# Patient Record
Sex: Female | Born: 1970 | Race: Asian | Hispanic: No | Marital: Married | Smoking: Never smoker
Health system: Southern US, Community
[De-identification: ages and names within clinical notes are randomized; demographics above are authoritative.]

## PROBLEM LIST (undated history)

## (undated) DIAGNOSIS — I10 Essential (primary) hypertension: Secondary | ICD-10-CM

## (undated) DIAGNOSIS — F32A Depression, unspecified: Secondary | ICD-10-CM

## (undated) DIAGNOSIS — F329 Major depressive disorder, single episode, unspecified: Secondary | ICD-10-CM

## (undated) DIAGNOSIS — E119 Type 2 diabetes mellitus without complications: Secondary | ICD-10-CM

## (undated) HISTORY — DX: Major depressive disorder, single episode, unspecified: F32.9

## (undated) HISTORY — DX: Essential (primary) hypertension: I10

## (undated) HISTORY — DX: Depression, unspecified: F32.A

---

## 2001-02-25 ENCOUNTER — Encounter: Admission: RE | Admit: 2001-02-25 | Discharge: 2001-02-25 | Payer: Self-pay | Admitting: Family Medicine

## 2001-03-26 ENCOUNTER — Encounter: Admission: RE | Admit: 2001-03-26 | Discharge: 2001-03-26 | Payer: Self-pay | Admitting: Family Medicine

## 2001-03-29 ENCOUNTER — Encounter: Admission: RE | Admit: 2001-03-29 | Discharge: 2001-03-29 | Payer: Self-pay | Admitting: Family Medicine

## 2001-04-12 ENCOUNTER — Other Ambulatory Visit: Admission: RE | Admit: 2001-04-12 | Discharge: 2001-04-12 | Payer: Self-pay | Admitting: Family Medicine

## 2001-04-12 ENCOUNTER — Encounter: Admission: RE | Admit: 2001-04-12 | Discharge: 2001-04-12 | Payer: Self-pay | Admitting: Family Medicine

## 2001-05-22 ENCOUNTER — Encounter: Admission: RE | Admit: 2001-05-22 | Discharge: 2001-05-22 | Payer: Self-pay | Admitting: Family Medicine

## 2001-06-07 ENCOUNTER — Encounter: Admission: RE | Admit: 2001-06-07 | Discharge: 2001-06-07 | Payer: Self-pay | Admitting: Family Medicine

## 2001-06-13 ENCOUNTER — Encounter: Admission: RE | Admit: 2001-06-13 | Discharge: 2001-06-13 | Payer: Self-pay | Admitting: Family Medicine

## 2001-07-09 ENCOUNTER — Encounter: Admission: RE | Admit: 2001-07-09 | Discharge: 2001-07-09 | Payer: Self-pay | Admitting: *Deleted

## 2001-08-16 ENCOUNTER — Encounter (INDEPENDENT_AMBULATORY_CARE_PROVIDER_SITE_OTHER): Payer: Self-pay | Admitting: Specialist

## 2001-08-16 ENCOUNTER — Ambulatory Visit (HOSPITAL_BASED_OUTPATIENT_CLINIC_OR_DEPARTMENT_OTHER): Admission: RE | Admit: 2001-08-16 | Discharge: 2001-08-16 | Payer: Self-pay | Admitting: Otolaryngology

## 2001-11-25 ENCOUNTER — Encounter: Admission: RE | Admit: 2001-11-25 | Discharge: 2001-11-25 | Payer: Self-pay | Admitting: Family Medicine

## 2002-02-19 ENCOUNTER — Encounter: Admission: RE | Admit: 2002-02-19 | Discharge: 2002-02-19 | Payer: Self-pay | Admitting: Family Medicine

## 2002-03-11 ENCOUNTER — Encounter: Admission: RE | Admit: 2002-03-11 | Discharge: 2002-03-11 | Payer: Self-pay | Admitting: Family Medicine

## 2002-03-26 ENCOUNTER — Encounter: Admission: RE | Admit: 2002-03-26 | Discharge: 2002-03-26 | Payer: Self-pay | Admitting: Family Medicine

## 2002-03-28 ENCOUNTER — Encounter: Admission: RE | Admit: 2002-03-28 | Discharge: 2002-03-28 | Payer: Self-pay | Admitting: Sports Medicine

## 2002-03-28 ENCOUNTER — Encounter: Payer: Self-pay | Admitting: Sports Medicine

## 2002-10-13 ENCOUNTER — Encounter: Admission: RE | Admit: 2002-10-13 | Discharge: 2002-10-13 | Payer: Self-pay | Admitting: Family Medicine

## 2002-10-13 ENCOUNTER — Other Ambulatory Visit: Admission: RE | Admit: 2002-10-13 | Discharge: 2002-10-13 | Payer: Self-pay | Admitting: Family Medicine

## 2002-11-12 ENCOUNTER — Encounter: Admission: RE | Admit: 2002-11-12 | Discharge: 2002-11-12 | Payer: Self-pay | Admitting: Family Medicine

## 2002-12-01 ENCOUNTER — Encounter: Admission: RE | Admit: 2002-12-01 | Discharge: 2002-12-01 | Payer: Self-pay | Admitting: Family Medicine

## 2003-01-02 ENCOUNTER — Encounter: Admission: RE | Admit: 2003-01-02 | Discharge: 2003-01-02 | Payer: Self-pay | Admitting: Sports Medicine

## 2003-02-24 ENCOUNTER — Encounter: Admission: RE | Admit: 2003-02-24 | Discharge: 2003-02-24 | Payer: Self-pay | Admitting: Family Medicine

## 2003-02-28 ENCOUNTER — Emergency Department (HOSPITAL_COMMUNITY): Admission: EM | Admit: 2003-02-28 | Discharge: 2003-03-01 | Payer: Self-pay | Admitting: Emergency Medicine

## 2003-03-02 ENCOUNTER — Encounter: Admission: RE | Admit: 2003-03-02 | Discharge: 2003-03-02 | Payer: Self-pay | Admitting: Family Medicine

## 2003-04-20 ENCOUNTER — Encounter: Admission: RE | Admit: 2003-04-20 | Discharge: 2003-04-20 | Payer: Self-pay | Admitting: Family Medicine

## 2003-05-01 ENCOUNTER — Encounter: Admission: RE | Admit: 2003-05-01 | Discharge: 2003-05-01 | Payer: Self-pay | Admitting: Sports Medicine

## 2003-06-25 ENCOUNTER — Encounter: Admission: RE | Admit: 2003-06-25 | Discharge: 2003-06-25 | Payer: Self-pay | Admitting: Family Medicine

## 2003-07-09 ENCOUNTER — Encounter: Admission: RE | Admit: 2003-07-09 | Discharge: 2003-07-09 | Payer: Self-pay | Admitting: Sports Medicine

## 2003-07-30 ENCOUNTER — Encounter: Admission: RE | Admit: 2003-07-30 | Discharge: 2003-07-30 | Payer: Self-pay | Admitting: Family Medicine

## 2003-12-25 ENCOUNTER — Ambulatory Visit: Payer: Self-pay | Admitting: Family Medicine

## 2004-03-14 ENCOUNTER — Ambulatory Visit: Payer: Self-pay | Admitting: Family Medicine

## 2004-05-27 ENCOUNTER — Ambulatory Visit: Payer: Self-pay | Admitting: Family Medicine

## 2004-10-10 ENCOUNTER — Ambulatory Visit: Payer: Self-pay | Admitting: Sports Medicine

## 2004-10-31 ENCOUNTER — Ambulatory Visit: Payer: Self-pay | Admitting: Family Medicine

## 2004-11-16 ENCOUNTER — Encounter (INDEPENDENT_AMBULATORY_CARE_PROVIDER_SITE_OTHER): Payer: Self-pay | Admitting: *Deleted

## 2004-11-16 LAB — CONVERTED CEMR LAB

## 2004-11-25 ENCOUNTER — Ambulatory Visit: Payer: Self-pay | Admitting: Family Medicine

## 2004-12-26 ENCOUNTER — Ambulatory Visit: Payer: Self-pay | Admitting: Family Medicine

## 2005-01-16 DIAGNOSIS — K219 Gastro-esophageal reflux disease without esophagitis: Secondary | ICD-10-CM | POA: Insufficient documentation

## 2005-01-18 ENCOUNTER — Ambulatory Visit: Payer: Self-pay | Admitting: Family Medicine

## 2005-02-01 ENCOUNTER — Ambulatory Visit: Payer: Self-pay | Admitting: Family Medicine

## 2006-03-15 DIAGNOSIS — R51 Headache: Secondary | ICD-10-CM

## 2006-03-15 DIAGNOSIS — R519 Headache, unspecified: Secondary | ICD-10-CM | POA: Insufficient documentation

## 2006-03-16 ENCOUNTER — Encounter (INDEPENDENT_AMBULATORY_CARE_PROVIDER_SITE_OTHER): Payer: Self-pay | Admitting: *Deleted

## 2006-04-11 ENCOUNTER — Telehealth: Payer: Self-pay | Admitting: *Deleted

## 2006-04-13 ENCOUNTER — Ambulatory Visit: Payer: Self-pay | Admitting: Family Medicine

## 2006-04-13 ENCOUNTER — Encounter (INDEPENDENT_AMBULATORY_CARE_PROVIDER_SITE_OTHER): Payer: Self-pay | Admitting: Family Medicine

## 2006-04-13 DIAGNOSIS — E785 Hyperlipidemia, unspecified: Secondary | ICD-10-CM

## 2006-05-25 ENCOUNTER — Ambulatory Visit: Payer: Self-pay | Admitting: Sports Medicine

## 2006-05-25 LAB — CONVERTED CEMR LAB
Ketones, urine, test strip: NEGATIVE
Nitrite: NEGATIVE
Urobilinogen, UA: 0.2
WBC Urine, dipstick: NEGATIVE

## 2006-06-27 ENCOUNTER — Encounter: Payer: Self-pay | Admitting: Family Medicine

## 2006-06-27 ENCOUNTER — Ambulatory Visit: Payer: Self-pay | Admitting: Family Medicine

## 2006-06-27 LAB — CONVERTED CEMR LAB: LDL Goal: 160 mg/dL

## 2006-07-24 ENCOUNTER — Telehealth: Payer: Self-pay | Admitting: *Deleted

## 2006-07-25 ENCOUNTER — Ambulatory Visit: Payer: Self-pay | Admitting: Family Medicine

## 2006-11-01 ENCOUNTER — Emergency Department (HOSPITAL_COMMUNITY): Admission: EM | Admit: 2006-11-01 | Discharge: 2006-11-01 | Payer: Self-pay | Admitting: Emergency Medicine

## 2006-11-05 ENCOUNTER — Telehealth: Payer: Self-pay | Admitting: *Deleted

## 2006-11-06 ENCOUNTER — Ambulatory Visit: Payer: Self-pay | Admitting: Family Medicine

## 2006-11-06 DIAGNOSIS — IMO0002 Reserved for concepts with insufficient information to code with codable children: Secondary | ICD-10-CM

## 2006-12-05 ENCOUNTER — Ambulatory Visit: Payer: Self-pay | Admitting: Family Medicine

## 2006-12-07 ENCOUNTER — Telehealth: Payer: Self-pay | Admitting: *Deleted

## 2007-01-28 ENCOUNTER — Ambulatory Visit: Payer: Self-pay | Admitting: Sports Medicine

## 2007-01-28 ENCOUNTER — Encounter (INDEPENDENT_AMBULATORY_CARE_PROVIDER_SITE_OTHER): Payer: Self-pay | Admitting: Family Medicine

## 2007-02-06 ENCOUNTER — Ambulatory Visit: Payer: Self-pay | Admitting: Family Medicine

## 2007-09-20 ENCOUNTER — Encounter (INDEPENDENT_AMBULATORY_CARE_PROVIDER_SITE_OTHER): Payer: Self-pay | Admitting: Family Medicine

## 2007-09-20 ENCOUNTER — Ambulatory Visit: Payer: Self-pay | Admitting: Family Medicine

## 2007-09-20 DIAGNOSIS — F329 Major depressive disorder, single episode, unspecified: Secondary | ICD-10-CM

## 2007-09-20 DIAGNOSIS — F3289 Other specified depressive episodes: Secondary | ICD-10-CM | POA: Insufficient documentation

## 2007-09-24 ENCOUNTER — Encounter (INDEPENDENT_AMBULATORY_CARE_PROVIDER_SITE_OTHER): Payer: Self-pay | Admitting: Family Medicine

## 2007-10-25 ENCOUNTER — Ambulatory Visit: Payer: Self-pay | Admitting: Family Medicine

## 2007-10-25 ENCOUNTER — Encounter (INDEPENDENT_AMBULATORY_CARE_PROVIDER_SITE_OTHER): Payer: Self-pay | Admitting: Family Medicine

## 2007-11-01 ENCOUNTER — Encounter (INDEPENDENT_AMBULATORY_CARE_PROVIDER_SITE_OTHER): Payer: Self-pay | Admitting: Family Medicine

## 2007-11-07 ENCOUNTER — Telehealth: Payer: Self-pay | Admitting: *Deleted

## 2008-02-03 ENCOUNTER — Telehealth (INDEPENDENT_AMBULATORY_CARE_PROVIDER_SITE_OTHER): Payer: Self-pay | Admitting: Family Medicine

## 2008-05-06 ENCOUNTER — Ambulatory Visit: Payer: Self-pay | Admitting: Family Medicine

## 2008-05-06 ENCOUNTER — Encounter: Payer: Self-pay | Admitting: Family Medicine

## 2008-05-06 ENCOUNTER — Telehealth (INDEPENDENT_AMBULATORY_CARE_PROVIDER_SITE_OTHER): Payer: Self-pay | Admitting: Family Medicine

## 2008-05-06 DIAGNOSIS — R1011 Right upper quadrant pain: Secondary | ICD-10-CM | POA: Insufficient documentation

## 2008-05-06 LAB — CONVERTED CEMR LAB
AST: 19 units/L (ref 0–37)
Albumin: 4.1 g/dL (ref 3.5–5.2)
Alkaline Phosphatase: 52 units/L (ref 39–117)
Glucose, Bld: 80 mg/dL (ref 70–99)
Potassium: 4 meq/L (ref 3.5–5.3)
Sodium: 138 meq/L (ref 135–145)
Total Protein: 7.9 g/dL (ref 6.0–8.3)

## 2009-10-04 ENCOUNTER — Encounter: Payer: Self-pay | Admitting: Family Medicine

## 2009-10-04 ENCOUNTER — Ambulatory Visit: Payer: Self-pay | Admitting: Family Medicine

## 2009-10-04 ENCOUNTER — Ambulatory Visit (HOSPITAL_COMMUNITY)
Admission: RE | Admit: 2009-10-04 | Discharge: 2009-10-04 | Payer: Self-pay | Source: Home / Self Care | Admitting: Family Medicine

## 2009-10-04 DIAGNOSIS — R079 Chest pain, unspecified: Secondary | ICD-10-CM

## 2010-01-21 ENCOUNTER — Ambulatory Visit
Admission: RE | Admit: 2010-01-21 | Discharge: 2010-01-21 | Payer: Self-pay | Source: Home / Self Care | Attending: Family Medicine | Admitting: Family Medicine

## 2010-01-21 DIAGNOSIS — N912 Amenorrhea, unspecified: Secondary | ICD-10-CM | POA: Insufficient documentation

## 2010-01-21 DIAGNOSIS — I10 Essential (primary) hypertension: Secondary | ICD-10-CM | POA: Insufficient documentation

## 2010-01-21 LAB — CONVERTED CEMR LAB
Beta hcg, urine, semiquantitative: NEGATIVE
Bilirubin Urine: NEGATIVE
Blood in Urine, dipstick: NEGATIVE
Glucose, Urine, Semiquant: NEGATIVE
Protein, U semiquant: NEGATIVE
Whiff Test: NEGATIVE
pH: 6.5

## 2010-02-13 LAB — CONVERTED CEMR LAB
Alkaline Phosphatase: 56 units/L (ref 39–117)
BUN: 10 mg/dL (ref 6–23)
Glucose, Bld: 89 mg/dL (ref 70–99)
Total Bilirubin: 0.3 mg/dL (ref 0.3–1.2)

## 2010-02-15 NOTE — Assessment & Plan Note (Signed)
Summary: birth control, abd pain    Vital Signs:  Patient profile:   40 year old female Height:      60.5 inches Weight:      152 pounds BMI:     29.30 Temp:     98.6 degrees F oral Pulse rate:   80 / minute BP sitting:   128 / 84  (right arm) Cuff size:   regular  Vitals Entered By: Tessie Fass CMA (October 04, 2009 2:52 PM) CC: F/U headache  Is Patient Diabetic? No Pain Assessment Patient in pain? yes     Location: head Intensity: 6   Primary Care Provider:  Doree Albee MD  CC:  F/U headache .  History of Present Illness: 40 YOF here for followup visit: -Difficult history secondary to english being second language -Most recent followup > 1 year ago  Pain: Pt reports non specific chest and abdominal pain. Denies any radiation of pain to neck or jaw or diaphoresis per pt. Paiin seems be worse at night, after laying down in bed. No fevers, chills, N/V/D/C. Has stopped taking ppi for at least 1 year  Birth Control: Pt wishes to switch birth control as insurance is no longer covering ortho evra patch. No change in menstrual period since transitioning off of ortho-evra 1-2 months ago. Not sexually active.   Physical Exam  General:  alert and well-nourished.   Head:  normocephalic and atraumatic.   Eyes:  vision grossly intact.   Ears:  R ear normal and L ear normal.   Mouth:  good dentition.   Neck:  supple, full ROM, no LAD  Lungs:  CTAB, no wheezes, rales, rhoncii Heart:  RRR< no rubs, gallops, murmurs  Abdomen:  soft, NT, ND,+ bowel sounds    Impression & Recommendations:  Problem # 1:  CHEST PAIN UNSPECIFIED (ICD-786.50) Will obtain 12 lead EKG to rule out any ischemic cardiac changes. No current chest pain while in office. Overall symptomatology likely secondary to untreated reflux. Will start back on protonix for appropriate coverage. WIll also obtain CMET to better assess hepatic function. Given language barrier, pt instructed to return to clinic in 1-2  weeks with montyard interpretor to to better assess symptoms. Pt agreeable to plan. Chest/Abd pain red flags discussed.  Orders: 12 Lead EKG (12 Lead EKG) [facility]- Est Level  3 ([address])  Problem # 2:  CONTRACEPTIVE MANAGEMENT (ICD-V25.09) WIll start pt on micronor as pt desires pills for birth control. WIll need pap in near future. See #1 about translator. Will assess at next visit.  Orders: [facility]- Est Level  3 ([address])  Complete Medication List: 1)  Celexa 20 Mg Tabs (Citalopram hydrobromide) .Marland Kitchen.. 1 tablet by mouth daily 2)  Flexeril 10 Mg Tabs (Cyclobenzaprine hcl) .Marland Kitchen.. 1 tablet by mouth at bedtime for muscle spasm 3)  Ortho Evra 150-20 Mcg/24hr Ptwk (Norelgestromin-eth estradiol) .... Apply to skin each week for 3 weeks.  take one week off. 4)  Protonix 40 Mg Tbec (Pantoprazole sodium) .... Take 1 tablet daily 5)  Ortho Micronor 0.35 Mg Tabs (Norethindrone) .... Take 1 tablet daily  Other Orders: Comp Met-[facility] ([id number]) Mammogram (Screening) (Mammo) Future Orders: Pap Smear-[facility] ([id number]) ... 12/15/2009 T-Lipid Profile [phone]) ... 10/05/2010  Patient Instructions: 1)  It was good to meet you today 2)  We will be starting you on medication for heartburn 3)  You will also be placed on a new birth control pill 4)  Come back to see me in 1-2 weeks with an  interpretor to go over your headache and belly pain 5)  If you have any fever, worsening abdominal pain, chest pain, or any other concerns, please give [place] a call.  6)  God Bless,  7)  Doree Albee MD  Prescriptions: ORTHO MICRONOR 0.35 MG TABS (NORETHINDRONE) take 1 tablet daily  #30 tabs x 3   Entered and Authorized by:   Doree Albee MD   Signed by:   Doree Albee MD on 10/04/2009   Method used:   Print then Give to Patient   RxID:   [id number] PROTONIX 40 MG TBEC (PANTOPRAZOLE SODIUM) take 1 tablet daily  #30 x 3   Entered and Authorized by:   Doree Albee MD   Signed by:   Doree Albee MD on 10/04/2009    Method used:   Print then Give to Patient   RxID:   [id number]    Prevention & Chronic Care Immunizations   Influenza vaccine: Fluvax 3+  (11/06/2006)   Influenza vaccine due: 11/06/2007    Tetanus booster: Not documented    Pneumococcal vaccine: Not documented  Other Screening   Pap smear: normal  (10/25/2007)   Pap smear action/deferral: Ordered  (10/04/2009)   Pap smear due: 10/24/2008    Mammogram: Not documented   Mammogram action/deferral: Ordered  (10/04/2009)   Smoking status: never  (05/06/2008)  Lipids   Total Cholesterol: Not documented   Lipid panel action/deferral: Lipid Panel ordered   LDL: Not documented   LDL Direct: Not documented   HDL: Not documented   Triglycerides: Not documented    SGOT (AST): 19  (05/06/2008)   SGPT (ALT): 19  (05/06/2008) CMP ordered    Alkaline phosphatase: 52  (05/06/2008)   Total bilirubin: 0.3  (05/06/2008)    Lipid flowsheet reviewed?: Yes   Progress toward LDL goal: Unchanged  Self-Management Support :    Lipid self-management support: Not documented    Nursing Instructions: Schedule screening mammogram (see order) Pap smear today

## 2010-02-17 NOTE — Assessment & Plan Note (Signed)
Summary: c/o abd pain/[place]/bmc   Vital Signs:  Patient profile:   40 year old female Height:      60.5 inches Weight:      153 pounds BMI:     29.50 Temp:     98.3 degrees F oral Pulse rate:   80 / minute BP sitting:   153 / 100  (left arm) Cuff size:   regular  Vitals Entered By: Tessie Fass CMA (January 21, 2010 3:38 PM) CC: abdominal pain Pain Assessment Patient in pain? yes     Location: abdomen Intensity: 5   Primary Care Provider:  Doree Albee MD  CC:  abdominal pain.  History of Present Illness:    Pain all over abdomen x 2 weeks  radiates to back, pain described as cramping pain, pain with heavy lifting, no change with food, has cloudy urine,  +dysuria, incontinence occ with cough, no constipation, no fever, but chills last week, no n/v ,  +discharge, +pruritis with discharge, foul smell a few days ago Normal period in December  +reflux feels burning sensation after eating that moves up, different from cramping   now with irregular cyles, has not had a cyle this month, was due the first of January    Current Medications (verified): 1)  Prilosec 20 Mg Cpdr (Omeprazole) .Marland Kitchen.. 1 By Mouth Daily As Needed Heartburn  Allergies (verified): No Known Drug Allergies  Review of Systems       Per HPI  Physical Exam  General:  alert and well-nourished.  Vital signs noted  Mouth:  MMM Lungs:  CTAB, Heart:  RRR Abdomen:  soft and normal bowel sounds.  Mild TTP over suprapubic region and epigastrium no rebound, no gaurding, no masses felt non distended No CVA tenderness no organomegaly Genitalia:  normal introitus and no external lesions.  minimal clear vaginal discharge, granular apperance to cervix, no friability, no bleeding no CMT no adenxeal masses   Impression & Recommendations:  Problem # 1:  ABDOMINAL PAIN (ICD-789.00) Assessment New non specific exam, UA, Wet prep negative. Upreg negative  Treat reflux Pain not localized for gallballder,  appendix etiology Pt to follow up if menses does not occur, this may be cramping secondarhy to delayed period Pt due for PAP SMEAR Orders: Urinalysis-[facility] (00000) [facility]- Est  Level 4 ([address])  Problem # 2:  GASTROESOPHAGEAL REFLUX DISEASE (ICD-530.81) Assessment: Deteriorated  Start low dose as needed PPI Her updated medication list for this problem includes:    Prilosec 20 Mg Cpdr (Omeprazole) .Marland Kitchen... 1 by mouth daily as needed heartburn  Orders: [facility]- Est  Level 4 (99214)  Problem # 3:  ELEVATED BP READING WITHOUT DX HYPERTENSION (ICD-796.2) Assessment: New  Would recheck at next visit, pt has had some borderline BP before.  Orders: [facility]- Est  Level 4 ([address]214)  Complete Medication List: 1)  Prilosec 20 Mg Cpdr (Omeprazole) .Marland Kitchen.. 1 by mouth daily as needed heartburn  Other Orders: [facility] Preg[facility] ([address]) [facility]  Patient Instructions: 1)  Your pregnancy test is negative 2)  You do not have a urine infection 3)  Start the Omeprazole daily as needed for heartburn 4)  If your cycle does not come on within the next month return for follow-up visit 5)  If you have fever, vomiting, or increased pain then return for a recheck Prescriptions: PRILOSEC 20 MG CPDR (OMEPRAZOLE) 1 by mouth daily as needed heartburn  #30 x 3   Entered and Authorized by:   Milinda Antis MD  Signed by:   Milinda Antis MD on 01/21/2010   Method used:   Electronically to        [facility]. [address] [phone]* (retail)       [address]       [place], [place]  [address]       Ph: [phone] or [phone]       Fax: [phone]   RxID:   [id number]    Orders Added: 1)  Urinalysis-[facility] [00000] 2)  U Preg-[facility] [81025] 3)  Wet Prep- [facility] [87210] 4)  [facility]- Est  Level 4 [66440]    Laboratory Results   Urine Tests  Date/Time Received: January 21, 2010 3:56 PM  Date/Time Reported: January 21, 2010 4:09 PM   Routine Urinalysis   Color: yellow Appearance:  Clear Glucose: negative   (Normal Range: Negative) Bilirubin: negative   (Normal Range: Negative) Ketone: trace (5)   (Normal Range: Negative) Spec. Gravity: >=1.030   (Normal Range: 1.003-1.035) Blood: negative   (Normal Range: Negative) pH: 6.5   (Normal Range: 5.0-8.0) Protein: negative   (Normal Range: Negative) Urobilinogen: 0.2   (Normal Range: 0-1) Nitrite: negative   (Normal Range: Negative) Leukocyte Esterace: negative   (Normal Range: Negative)    Urine HCG: negative Comments: ...............test performed by......Marland KitchenBonnie A. [place], MLS (ASCP)cm  Date/Time Received: January 21, 2010 4:14 PM  Date/Time Reported: January 21, 2010 4:26 PM  [employer] Source: vaginal WBC/hpf: 0-3 Bacteria/hpf: 2+  Rods Clue cells/hpf: none  Negative whiff Yeast/hpf: none Trichomonas/hpf: none Comments: ...........test performed by...........Marland KitchenTerese Door, CMA      Prevention & Chronic Care Immunizations   Influenza vaccine: Fluvax 3+  (11/06/2006)   Influenza vaccine due: 11/06/2007    Tetanus booster: Not documented    Pneumococcal vaccine: Not documented  Other Screening   Pap smear: normal  (10/25/2007)   Pap smear action/deferral: Ordered  (10/04/2009)   Pap smear due: 10/24/2008    Mammogram: Not documented   Mammogram action/deferral: Ordered  (10/04/2009)   Smoking status: never  (05/06/2008)  Lipids   Total Cholesterol: Not documented   Lipid panel action/deferral: Lipid Panel ordered   LDL: Not documented   LDL Direct: Not documented   HDL: Not documented   Triglycerides: Not documented    SGOT (AST): 14  (10/04/2009)   SGPT (ALT): 18  (10/04/2009)   Alkaline phosphatase: 56  (10/04/2009)   Total bilirubin: 0.3  (10/04/2009)  Self-Management Support :    Lipid self-management support: Not documented

## 2010-02-23 ENCOUNTER — Encounter: Payer: Self-pay | Admitting: *Deleted

## 2010-04-08 ENCOUNTER — Encounter: Payer: Self-pay | Admitting: Family Medicine

## 2010-04-08 ENCOUNTER — Ambulatory Visit (HOSPITAL_COMMUNITY)
Admission: RE | Admit: 2010-04-08 | Discharge: 2010-04-08 | Disposition: A | Discharge status: Home / Self Care | Payer: Self-pay | Source: Ambulatory Visit | Attending: Family Medicine | Admitting: Family Medicine

## 2010-04-08 ENCOUNTER — Ambulatory Visit (INDEPENDENT_AMBULATORY_CARE_PROVIDER_SITE_OTHER): Payer: Self-pay | Admitting: Family Medicine

## 2010-04-08 VITALS — Temp 98.4°F | Wt 149.4 lb

## 2010-04-08 DIAGNOSIS — M545 Low back pain, unspecified: Secondary | ICD-10-CM | POA: Insufficient documentation

## 2010-04-08 DIAGNOSIS — M549 Dorsalgia, unspecified: Secondary | ICD-10-CM

## 2010-04-08 DIAGNOSIS — G5702 Lesion of sciatic nerve, left lower limb: Secondary | ICD-10-CM | POA: Insufficient documentation

## 2010-04-08 DIAGNOSIS — M79609 Pain in unspecified limb: Secondary | ICD-10-CM | POA: Insufficient documentation

## 2010-04-08 DIAGNOSIS — R300 Dysuria: Secondary | ICD-10-CM

## 2010-04-08 LAB — POCT URINALYSIS DIPSTICK
Leukocytes, UA: NEGATIVE
Protein, UA: NEGATIVE
Urobilinogen, UA: 0.2
pH, UA: 6.5

## 2010-04-08 MED ORDER — DICLOFENAC SODIUM 75 MG PO TBEC: 75.0000 mg | DELAYED_RELEASE_TABLET | Freq: Two times a day (BID) | ORAL | Status: AC

## 2010-04-08 MED ORDER — CYCLOBENZAPRINE HCL 10 MG PO TABS: 10.0000 mg | ORAL_TABLET | Freq: Three times a day (TID) | ORAL | Status: AC | PRN

## 2010-04-08 NOTE — Assessment & Plan Note (Signed)
Given neg UA, insignificant.

## 2010-04-08 NOTE — Progress Notes (Signed)
  Subjective:    Patient ID: Pamela Stewart, female    DOB: June 30, 1970, 40 y.o.   MRN: [id number]  HPI Has not previously had back pain.  Presents with three days of Rt lower lumbar pain radiating down lateral aspect of leg.  Some tingling.  No apparent weakness.  No trauma.  Does do lifting.    Review of Systems Denies abd pain, fever or change in bowel habits.  + dysuria     Objective:   Physical Exam Abd benign.  No CVA tenderness. Lower lumbar spasm. Normal strength and reflexes in both lower extremities.       Assessment & Plan:

## 2010-04-08 NOTE — Patient Instructions (Signed)
You have back spasms and a pinched nerve causing the right leg pain. Two medicines Diclofenac for pain and inflammation Cyclobenzaprene is a muscle relaxant for the spasm. As she gets better, stop the cyclobenzaprene first.

## 2010-04-08 NOTE — Assessment & Plan Note (Signed)
Good story for lumbar pain with radiculopathy.  No objective findings.  No hx of back problems.  Will check Xrays and treat conservatively.

## 2010-06-03 NOTE — Op Note (Signed)
NAME:  Pamela Stewart, Pamela Stewart                            ACCOUNT NO.:  [id number]   MEDICAL RECORD NO.:  [id number]                   PATIENT TYPE:   LOCATION:                                       FACILITY:   PHYSICIAN:  Dorna Leitz, M.D.                 DATE OF BIRTH:   DATE OF PROCEDURE:  08/16/2001  DATE OF DISCHARGE:                                 OPERATIVE REPORT   PREOPERATIVE DIAGNOSIS:  Chronic tonsillitis, bilateral inferior turbinate  hypertrophy.   POSTOPERATIVE DIAGNOSIS:  Chronic tonsillitis, bilateral inferior turbinate  hypertrophy.   PROCEDURE:  Tonsillectomy, bilateral inferior turbinate reduction.   SURGEON:  Dorna Leitz, M.D.   ANESTHESIA:  General.   ESTIMATED BLOOD LOSS:  20 cc.   SPECIMENS:  Tonsils and turbinates.   COMPLICATIONS:  None.   INDICATION:  This patient is a 40 year old female with a two year history of  headache and nasal congestion with posterior nasal drainage.  There is  ongoing nasal congestion which is not improved with Flonase.  In addition,  there have been chronic sore throats.  She was scheduled for tonsillectomy  prior to moving from [place].  The sore throat has been present for  two years.  For these reasons, tonsillectomy and bilateral inferior  turbinate reductions are performed.   FINDINGS:  The patient was noted to have cryptic 3+ bilateral palatine  tonsils.  There was perfuse bony and soft tissue hypertrophy of the inferior  turbinates.   PROCEDURE:  The patient was taken to the operating room and placed on the  table in a supine position.  She was then placed under general endotracheal  anesthesia and the table rotated counterclockwise 90 degrees.  The neck was  gently extended using a shoulder roll.  Bacitracin was placed on the lips,  and the head and body draped in the usual fashion.  The Crowe-Davis mouthgag  with a #4 tongue blade was then placed intraorally, opened and suspected on  the Mayo stand.   The right palatine tonsil was grasped with Allis clamps and  directed inferomedially.  The harmonic scalpel was then used to resect the  tonsil staying within the peritonsillar space.  The left palatine tonsil was  removed in identical fashion.  A small amount of left superior pole bleeding  was encountered which was controlled with suction cautery.   At this point, an NG tube was placed down the esophagus for suctioning of  the gastric contents.  Nasal cavities were then decongested and the inferior  turbinates injected with 1% Lidocaine with 1:100,000 epinephrine.  Time was  allowed for hemostasis.  A zero degree Storz-Hopkins endoscope was then used  to visualize the left nasal cavity.  A #15 blade was used to incise the  inferior turbinate.  Flaps were elevated medially and laterally and  redundant bone taken down approximately half of the  vertical dimension using  through cut forceps.  This was performed along the entire length of the  turbinate.  Suction cautery was used for hemostasis.  The right inferior  turbinate was reduced in identical fashion and suction cautery used.  Each  of the nasal cavities were then packed with Merocel packs coated with  Bactroban ointment.  They were tied to one another anterior to the  columella.  The oral cavity was suctioned out.  The table was rotated  clockwise 90 degrees to its original position.  The patient was awakened  from anesthesia and extubated in the operating room.  She was taken to the  Post-Anesthesia Care Unit in stable condition.  There were no complications.                                               Dorna Leitz, M.D.    SLJ/MEDQ  D:  08/16/2001  T:  08/22/2001  Job:  [id number]

## 2010-07-01 ENCOUNTER — Ambulatory Visit (INDEPENDENT_AMBULATORY_CARE_PROVIDER_SITE_OTHER): Payer: 59 | Admitting: Family Medicine

## 2010-07-01 ENCOUNTER — Encounter: Payer: Self-pay | Admitting: Family Medicine

## 2010-07-01 DIAGNOSIS — E785 Hyperlipidemia, unspecified: Secondary | ICD-10-CM

## 2010-07-01 DIAGNOSIS — Z309 Encounter for contraceptive management, unspecified: Secondary | ICD-10-CM

## 2010-07-01 LAB — POCT URINE PREGNANCY: Preg Test, Ur: NEGATIVE

## 2010-07-01 MED ORDER — ASPIRIN EC 81 MG PO TBEC: 81.0000 mg | DELAYED_RELEASE_TABLET | Freq: Every day | ORAL | Status: DC

## 2010-07-01 MED ORDER — NORGESTIMATE-ETH ESTRADIOL 0.25-35 MG-MCG PO TABS: 1.0000 | ORAL_TABLET | Freq: Every day | ORAL | Status: DC

## 2010-07-01 NOTE — Patient Instructions (Signed)
It was good to see you again  I am referring you to the gynecologist for you to discuss you possibely getting your tubes tied I am starting you on sprintec in the meantime I also want you to take a daily aspirin  Come back to see me in 2 weeks to talk about your headaches Otherwise call with any questions,  God Bless, Doree Albee MD

## 2010-07-08 DIAGNOSIS — Z309 Encounter for contraceptive management, unspecified: Secondary | ICD-10-CM | POA: Insufficient documentation

## 2010-07-08 NOTE — Assessment & Plan Note (Signed)
Discussed overall case with Dr. Leveda Anna. Pt at increased risk of HTN/VTE with OCP given age. Discussed these risks with the pt and daughter.  Broached issue if possible elective BTL, which pt is agreeable. Will refer to GYN for counseling on this issu e.Will give short term course of OCPs in the interim. Discussed red flags with use. Will also start on daily baby aspirin.

## 2010-07-08 NOTE — Progress Notes (Signed)
  Subjective:    Patient ID: Pamela Stewart, female    DOB: 1970-09-24, 40 y.o.   MRN: [id number]  HPI Pt here to discuss contraception. Pt desires to have no more children. Pt is desiring for OCPs. Pt was previously on mirena IUD several years ago. This was removed.  Pt states that she does not desire to have this again. Pt still menstruating. Non smoker. No hx/o DVT/VTE in the past.  Review of Systems See HPI, otherwise 12 point ROS negative.    Objective:   Physical Exam Gen: up in chair, NAD CV:RRR, no rubs, gallops, murmurs PULM: CTAB,  ABD: S/NT/+bowel sounds EXT: 2+ peripheral pulses    Assessment & Plan:  Contraception: Discussed overall case with Dr. Leveda Anna. Pt at increased risk of HTN/VTE with OCP given age. Discussed these risks with the pt and daughter.  Broached issue if possible elective BTL, which pt is agreeable. Will refer to GYN for counseling on this issu e.Will give short term course of OCPs in the interim. Discussed red flags with use. Will also start on daily baby aspirin.

## 2010-07-22 ENCOUNTER — Encounter: Payer: Self-pay | Admitting: Family Medicine

## 2010-07-22 ENCOUNTER — Ambulatory Visit (INDEPENDENT_AMBULATORY_CARE_PROVIDER_SITE_OTHER): Payer: 59 | Admitting: Family Medicine

## 2010-07-22 VITALS — BP 130/86 | HR 71 | Wt 147.6 lb

## 2010-07-22 DIAGNOSIS — G43909 Migraine, unspecified, not intractable, without status migrainosus: Secondary | ICD-10-CM

## 2010-07-22 DIAGNOSIS — R252 Cramp and spasm: Secondary | ICD-10-CM

## 2010-07-22 LAB — BASIC METABOLIC PANEL
Chloride: 103 mEq/L (ref 96–112)
Potassium: 4.8 mEq/L (ref 3.5–5.3)
Sodium: 138 mEq/L (ref 135–145)

## 2010-07-22 MED ORDER — KETOROLAC TROMETHAMINE 30 MG/ML IJ SOLN
30.0000 mg | Freq: Once | INTRAMUSCULAR | Status: AC
  Administered 2010-07-22: 30 mg via INTRAVENOUS

## 2010-07-22 NOTE — Patient Instructions (Signed)
It was good to see you today STOP taking your birth control pills. I think that this is the cause of your migraines worsening.  I am also going to check some labs today to see if you have any issues with your electrolytes that are causing you muscle cramping.  Come back to see me in 1-2 weeks to see how your headache is doing If you notice any worsening in your headache, any vision changes, significant weakness in one part of your body, give [place] a call or go to the ED.  Otherwise call with any questions, God Bless Doree Albee MD

## 2010-07-22 NOTE — Progress Notes (Signed)
  Subjective:    Patient ID: Pamela Stewart, female    DOB: 22-Sep-1970, 40 y.o.   MRN: [id number]  HPI Pt here to discuss migraines- Pt with previous hx/o migraines that was relatively intermittent. Was tolerable with using prn advil for pain on an intermittent basis. Pt reports acute worsening in headache since last clinical visit. Pt was placed on OCPs at that time in setting of referral to outpt GYN for possible BTL. Pt states that she has had persistent HA w/ HA being worst in PM/at night as well as in am. No focal neurological deficits. Does report some generalized R sided body pain. This has been worked up in the past w/ hx/o muscle spasms and chronic R back pain.        Review of Systems See HPI, otherwise 12 point ROS negative.  Objective:   Physical Exam Gen: up in chair, NAD HEENT: NCAT, EOMI, TMs clear bilaterally, + photophobia with funduscopic examination.  CV: RRR, no murmurs auscultated PULM: CTAB, no wheezes, rales, rhoncii ABD: S/NT/+ bowel sounds  EXT: 2+ peripheral pulses NEURO: CN II-XII grossly intact, no focal neuological deficits.        Assessment & Plan:  HA: Migraine exacerbation, likely secondary to OCP use. Pt instructed to hold OCP use. Will also check BMET in setting of muscle cramps. No noted focal neurological deficits noted on exam which is reassuring. Also noted longstanding hx/o headache per report. Will give toradol 30mg  IM x 1 for acute treatment. PRN ibuprofen for pain. Will follow up in 1 week for reassessment of sxs.  Contraception: Pt pending gyn followup for likely elective BTL. Told pt to use condoms for any sexual activity. Pt is declining any other form of birth control.

## 2010-07-25 DIAGNOSIS — G43909 Migraine, unspecified, not intractable, without status migrainosus: Secondary | ICD-10-CM | POA: Insufficient documentation

## 2010-07-25 NOTE — Assessment & Plan Note (Signed)
:   Migraine exacerbation, likely secondary to OCP use. Pt instructed to hold OCP use. Will also check BMET in setting of muscle cramps. No noted focal neurological deficits noted on exam which is reassuring. Also noted longstanding hx/o headache per report. Will give toradol 30mg  IM x 1 for acute treatment. PRN ibuprofen for pain. Will follow up in 1 week for reassessment of sxs.  Contraception: Pt pending gyn followup for likely elective BTL. Told pt to use condoms for any sexual activity. Pt is declining any other form of birth control.

## 2010-07-27 ENCOUNTER — Ambulatory Visit (INDEPENDENT_AMBULATORY_CARE_PROVIDER_SITE_OTHER): Payer: 59 | Admitting: Family Medicine

## 2010-07-27 ENCOUNTER — Ambulatory Visit: Payer: 59 | Admitting: Family Medicine

## 2010-07-27 ENCOUNTER — Encounter: Payer: Self-pay | Admitting: Family Medicine

## 2010-07-27 VITALS — BP 132/87 | HR 64 | Temp 98.3°F | Wt 147.0 lb

## 2010-07-27 DIAGNOSIS — R51 Headache: Secondary | ICD-10-CM

## 2010-07-27 MED ORDER — PROPRANOLOL HCL 40 MG PO TABS: 40.0000 mg | ORAL_TABLET | Freq: Three times a day (TID) | ORAL | Status: DC

## 2010-07-27 MED ORDER — KETOROLAC TROMETHAMINE 30 MG/ML IJ SOLN
30.0000 mg | Freq: Once | INTRAMUSCULAR | Status: AC
  Administered 2010-07-27: 30 mg via INTRAMUSCULAR

## 2010-07-27 MED ORDER — SUMATRIPTAN SUCCINATE 50 MG PO TABS: 50.0000 mg | ORAL_TABLET | Freq: Once | ORAL | Status: DC | PRN

## 2010-07-27 MED ORDER — PROPRANOLOL HCL 40 MG PO TABS: 40.0000 mg | ORAL_TABLET | Freq: Two times a day (BID) | ORAL | Status: DC

## 2010-07-27 NOTE — Patient Instructions (Signed)
It was good to see you today I am starting you on propranolol for your headache I have also wrote a prescription for imitrex for breakthrough migraines Come back to see me in 2 weeks for follow up of your headaches,  Please call with any questions that you may have God Bless, Doree Albee MD

## 2010-07-27 NOTE — Progress Notes (Signed)
  Subjective:    Patient ID: Pamela Stewart, female    DOB: Dec 17, 1970, 40 y.o.   MRN: [id number]  HPI Pt here for follow up on HA. Pt recently seen 07/21/10 for headache in setting of recently being placed on OCPs. OCPs recently d/c'd at most recent clinical visit as this was thought to be inciting agent for headache spike. Pt also given shot of toradol. \ Today- Pt states that HA resolved for approx 1-2 days s/p toradol, However recurred despite OCP d/c. Pt states that HA most prominent at night. Usually unilateral, frontal, with eye tearing, with alternating  Intermittent photophobia. No blurred vision, hemiparesis. Has a hx/o headaches in the past per pt. Currently with headache type pain.    Review of Systems See HPI, otherwise 12 point ROS negative.     Objective:   Physical Exam Gen: up in chair, NAD  HEENT: NCAT, EOMI, TMs clear bilaterally, + photophobia with funduscopic examination.  CV: RRR, no murmurs auscultated  PULM: CTAB, no wheezes, rales, rhoncii  ABD: S/NT/+ bowel sounds  EXT: 2+ peripheral pulses  NEURO: CN II-XII grossly intact, no focal neuological deficits.     Assessment & Plan:  HA: Likely migraine vs cluster headache given sxs. Will start propranolol for prophylactic treatment in setting of borderline BPs. Will also give triptan for breakthrough headaches. No known hx/o CAD/ischemic heart disease. Will follow up in 2-3 weeks to reassess sxs. Toradol 30 IM x1 in clinic.

## 2010-07-28 LAB — SEDIMENTATION RATE: Sed Rate: 32 mm/hr — ABNORMAL HIGH (ref 0–22)

## 2010-08-19 ENCOUNTER — Ambulatory Visit: Payer: 59 | Admitting: Family Medicine

## 2010-09-02 ENCOUNTER — Ambulatory Visit (INDEPENDENT_AMBULATORY_CARE_PROVIDER_SITE_OTHER): Payer: 59 | Admitting: Family Medicine

## 2010-09-16 ENCOUNTER — Ambulatory Visit (INDEPENDENT_AMBULATORY_CARE_PROVIDER_SITE_OTHER): Payer: 59 | Admitting: Family Medicine

## 2010-09-16 ENCOUNTER — Encounter: Payer: Self-pay | Admitting: Family Medicine

## 2010-09-16 DIAGNOSIS — R51 Headache: Secondary | ICD-10-CM

## 2010-09-16 DIAGNOSIS — Z309 Encounter for contraceptive management, unspecified: Secondary | ICD-10-CM

## 2010-09-16 DIAGNOSIS — K219 Gastro-esophageal reflux disease without esophagitis: Secondary | ICD-10-CM

## 2010-09-16 DIAGNOSIS — G43909 Migraine, unspecified, not intractable, without status migrainosus: Secondary | ICD-10-CM

## 2010-09-16 DIAGNOSIS — F329 Major depressive disorder, single episode, unspecified: Secondary | ICD-10-CM

## 2010-09-16 MED ORDER — BUPROPION HCL 75 MG PO TABS: 75.0000 mg | ORAL_TABLET | Freq: Three times a day (TID) | ORAL | Status: DC

## 2010-09-16 MED ORDER — PROPRANOLOL HCL 40 MG PO TABS: 80.0000 mg | ORAL_TABLET | Freq: Two times a day (BID) | ORAL | Status: DC

## 2010-09-16 MED ORDER — OMEPRAZOLE 20 MG PO CPDR: 20.0000 mg | DELAYED_RELEASE_CAPSULE | Freq: Every day | ORAL | Status: DC

## 2010-09-16 MED ORDER — FLUOXETINE HCL 10 MG PO CAPS: 10.0000 mg | ORAL_CAPSULE | Freq: Every day | ORAL | Status: DC

## 2010-09-16 NOTE — Patient Instructions (Addendum)
It was good to see you  I am increasing your headache medication  I am also placing you on medication for your mood I am putting you on medication for heartburn  Come back to see me in 2 weeks, Doree Albee MD

## 2010-09-19 NOTE — Assessment & Plan Note (Signed)
Will increase propranolol to 80 mg po BID. Will follow up in 2 weeks.

## 2010-09-19 NOTE — Assessment & Plan Note (Signed)
Will not place IUD today. Stressed importance of proper follow up with OB-GYN. Pt agreeable to plan. Will reassess for proper GYN follow up at next pt visit.

## 2010-09-19 NOTE — Progress Notes (Signed)
  Subjective:    Patient ID: Pamela Stewart, female    DOB: Apr 28, 1970, 40 y.o.   MRN: [id number]  HPI This is a chronic problem follow up;   Migraine : Pt states that HA has been intermittently worse since last clinical visit. Pt was recently placed on propranolol 40 BID in setting of concominant intermittent elevated BPs. Pt states that medication has been somewhat effective. Pt states that she has also been under a lot of stress with work as well as at home. Pt states that she has noticed HA worsen around time after dinner, or at night.   Mood: Pt also states that she has had significant depressive type sxs since last clinical visit. No HI/SI. HAs had issues with sleep with generalized feeling of anxiety that has been feeding into and exaerbations of HA.   Contraception: Pt previously scheduled to go to [facility] for referral for possible BTL vs hysterectomy. Pt has not gone to the appt. Pt stated previously that she did not desire an IUD. Pt states that she now desires this.    Review of Systems See HPI    Objective:   Physical Exam Gen: up in chair, NAD  HEENT: NCAT, EOMI, TMs clear bilaterally, + photophobia with funduscopic examination.  CV: RRR, no murmurs auscultated  PULM: CTAB, no wheezes, rales, rhoncii  ABD: S/NT/+ bowel sounds  EXT: 2+ peripheral pulses  NEURO: CN II-XII grossly intact, no focal neuological deficits.    Assessment & Plan:

## 2010-09-19 NOTE — Assessment & Plan Note (Signed)
Will start pt on wellbutrin for mood. PHQ-9 form not filled out due to time constraints and translation of written instructions. I suspect this may be contributing to headaches as well. Will Follow up in 2 weeks to reassess. Another consideration is transitioning pt to amitryptiline if pt's HAs are poorly controlled with high dose propranolol over

## 2010-09-19 NOTE — Assessment & Plan Note (Signed)
Will start pt on omeprazole to decrease of GI source as preciptitant

## 2010-09-30 ENCOUNTER — Ambulatory Visit (INDEPENDENT_AMBULATORY_CARE_PROVIDER_SITE_OTHER): Payer: 59 | Admitting: Family Medicine

## 2010-09-30 ENCOUNTER — Encounter: Payer: Self-pay | Admitting: Family Medicine

## 2010-09-30 DIAGNOSIS — F329 Major depressive disorder, single episode, unspecified: Secondary | ICD-10-CM

## 2010-09-30 DIAGNOSIS — G43909 Migraine, unspecified, not intractable, without status migrainosus: Secondary | ICD-10-CM

## 2010-09-30 DIAGNOSIS — Z23 Encounter for immunization: Secondary | ICD-10-CM

## 2010-09-30 MED ORDER — KETOROLAC TROMETHAMINE 60 MG/2ML IM SOLN
30.0000 mg | Freq: Once | INTRAMUSCULAR | Status: AC
  Administered 2010-09-30: 30 mg via INTRAMUSCULAR

## 2010-09-30 NOTE — Assessment & Plan Note (Signed)
Subjectively improved. Though, pt does report of daily headache. I do suspect that there is a large component of medication non-compliance in terms of pt's overall picture. ( in further discussion, pt states that she misses medications regularly). Will continue with current regimen. toradol shot given today for acute headache.  Plan to follow with pt in the next 1-2 weeks. Pt is instructed to bring in all medications taken at this visit in addition to family to more accurately assess medication regimen. Pt agreeable to plan.

## 2010-09-30 NOTE — Patient Instructions (Signed)
It was good to see you today  I am not going to make any medication changes today  Please set up an appointment with me in the next 1-2 weeks. Bring ALL of your medications with your at this visit so we can better review your medications. We will also try to set you up to see the gynecologist about your birth control options.  Call if you have any other questions,  God Bless Doree Albee MD

## 2010-09-30 NOTE — Progress Notes (Signed)
  Subjective:    Patient ID: Pamela Stewart, female    DOB: 24-Jun-1970, 40 y.o.   MRN: [id number]  HPI This is a follow up for chronic problems: Migraine: improved since increase of propranolol. HAs were previously 10/10, now 6/10. Still with HAs daily. Says she takes different medications for this including ASA, tylenol, and ibuprofen. Pt is unsure if she has taken imitrex for her headache as previously prescribed.   Mood: Pt states that her mood has improved since her last clinical visit. No HI/SI. When asked, pt states that she taking "whatever is on the sheet". Mood medications currently include prozac and wellbutrin.    Review of Systems See HPI     Objective:   Physical Exam Gen: up in chair, NAD CV: RRR, no murmurs auscultated  PULM: CTAB, no wheezes, rales, rhoncii  ABD: S/NT/+ bowel sounds  EXT: 2+ peripheral pulses    Assessment & Plan:

## 2010-09-30 NOTE — Assessment & Plan Note (Addendum)
Subjectively improved per pt. As previously stated, I am unsure of pt's compliance with medication regimen. I think there are significant potential barriers to medication regimen specifically language. Will make no medication changes today. Would like to follow up with pt in next 1-2 weeks with pt being instructed to bring in all medications as well as family to better assess medication regimen. Pt would likely greatly benefit from pharmacy clinic after assessment.

## 2010-11-18 ENCOUNTER — Other Ambulatory Visit (HOSPITAL_COMMUNITY)
Admission: RE | Admit: 2010-11-18 | Discharge: 2010-11-18 | Disposition: A | Discharge status: Home / Self Care | Payer: 59 | Source: Ambulatory Visit | Attending: Family Medicine | Admitting: Family Medicine

## 2010-11-18 ENCOUNTER — Encounter: Payer: Self-pay | Admitting: Family Medicine

## 2010-11-18 ENCOUNTER — Ambulatory Visit (INDEPENDENT_AMBULATORY_CARE_PROVIDER_SITE_OTHER): Payer: 59 | Admitting: Family Medicine

## 2010-11-18 DIAGNOSIS — Z01419 Encounter for gynecological examination (general) (routine) without abnormal findings: Secondary | ICD-10-CM | POA: Insufficient documentation

## 2010-11-18 DIAGNOSIS — Z124 Encounter for screening for malignant neoplasm of cervix: Secondary | ICD-10-CM

## 2010-11-18 DIAGNOSIS — Z309 Encounter for contraceptive management, unspecified: Secondary | ICD-10-CM

## 2010-11-18 LAB — POCT URINE PREGNANCY: Preg Test, Ur: NEGATIVE

## 2010-11-18 MED ORDER — LEVONORGESTREL 20 MCG/24HR IU IUD
INTRAUTERINE_SYSTEM | Freq: Once | INTRAUTERINE | Status: AC
  Administered 2010-11-18: 15:00:00 via INTRAUTERINE

## 2010-11-18 NOTE — Patient Instructions (Signed)
It was good to see you today  I am referring you to the gynecologist for a possible IUD insertion and to discuss birth control options. Come back to see me in 1 month  Be sure to [facility] MEDICINES AT YOUR NEXT VISIT Call with any questions,  God Bless, Doree Albee MD

## 2010-11-19 DIAGNOSIS — Z124 Encounter for screening for malignant neoplasm of cervix: Secondary | ICD-10-CM | POA: Insufficient documentation

## 2010-11-19 NOTE — Assessment & Plan Note (Addendum)
Pap smear also obtained. Last 3 pap smears reviewed, which were all negative.  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

## 2010-11-19 NOTE — Assessment & Plan Note (Addendum)
Procedure: Placement of  Mirena  Patient was counseled regarding the risks/benefits/alternatives of the IUD. Consent was obtained and all questions answered. Urine pregnancy test negative, patient currently menstruating.  Description: Cervix was swabbed three times with Betadine swabs. Sterile gloves donned. Sterile single-tooth tenaculum used to grasp posterior lip of the cervix and straighten the endocervical canal. Noted mild cervical stricture at cervical os.  Mild difficulty noted in placing sound past cervical os.  Uterus sounded to 6.5 cm. IUD loaded per manufacturer's instruction and flange set to 6.5 cm. IUD placed per manufacturer's directions. Strings trimmed to1cm. IUD was noted to be visible in cervical os after placement and was therefore removed.    After procedure, pt was instructed to follow with GYN for further management of contraception and IUD placement as IUD was unsuccessfully placed today. Overall case reviewed with Dr. Deirdre Priest. Will place pt on 1 month of OCPs.

## 2010-11-19 NOTE — Progress Notes (Signed)
  Subjective:    Patient ID: Pamela Stewart, female    DOB: [birth year], 40 y.o.   MRN: [id number]  HPI Pt is here for general medical follow up and contraception management.  Pt was seen approx 2 months ago for chronic problem follow up. At the time there was a high concern for medication noncompliance as pt had recurrent episodes of headache, poor mood, and reflux as well as pt being unsure of medication regimen. Pt was instructed to come back in today with all medications, as to better assess regimen.   Today, pt states that she has forgotten to bring medications.   Contraception Management: Pt was previously referred to GYN for this as pt refused IUD and implanon in our past visit. Pt was considering hysterectomy vs OCPs. It was discussed with pt in the past that OCPs may have some secondary complications including VTE and increased BP and this was deferred.   Today pt states that she now desires IUD for contraception. Pt states that she has not gone to GYN referral because she was unsure of appt. Pt states that she has reconsidered this and thinks this would be the best option for birth control vs. Hysterectomy.    Review of Systems See HPI     Objective:   Physical Exam Gen: in bed, NAD GU: normal external genitalia, noted diffuse cystic changes on cervix consistent with possible nabothian cysts, blood in cervical os consistent with menses.       Assessment & Plan:  Medication Management: Discussed with pt importance of bringing medications to assess overall regimen. Pt instructed to follow up in 1 month. Will likely refer to pharmacy clinic in the next 1-2 months for medication reconciliation.

## 2010-12-02 ENCOUNTER — Ambulatory Visit: Payer: 59 | Admitting: Gynecology

## 2010-12-23 ENCOUNTER — Ambulatory Visit: Payer: 59 | Admitting: Family Medicine

## 2011-03-08 ENCOUNTER — Encounter: Payer: Self-pay | Admitting: *Deleted

## 2011-03-10 ENCOUNTER — Ambulatory Visit (INDEPENDENT_AMBULATORY_CARE_PROVIDER_SITE_OTHER): Payer: 59 | Admitting: Family Medicine

## 2011-03-10 ENCOUNTER — Encounter: Payer: Self-pay | Admitting: Family Medicine

## 2011-03-10 DIAGNOSIS — K219 Gastro-esophageal reflux disease without esophagitis: Secondary | ICD-10-CM

## 2011-03-10 DIAGNOSIS — Z309 Encounter for contraceptive management, unspecified: Secondary | ICD-10-CM

## 2011-03-10 DIAGNOSIS — F329 Major depressive disorder, single episode, unspecified: Secondary | ICD-10-CM

## 2011-03-10 DIAGNOSIS — G43909 Migraine, unspecified, not intractable, without status migrainosus: Secondary | ICD-10-CM

## 2011-03-10 DIAGNOSIS — R51 Headache: Secondary | ICD-10-CM

## 2011-03-10 MED ORDER — SUMATRIPTAN SUCCINATE 50 MG PO TABS: 50.0000 mg | ORAL_TABLET | Freq: Once | ORAL | Status: DC | PRN

## 2011-03-10 MED ORDER — OMEPRAZOLE 20 MG PO CPDR: 20.0000 mg | DELAYED_RELEASE_CAPSULE | Freq: Every day | ORAL | Status: DC

## 2011-03-10 MED ORDER — FLUOXETINE HCL 10 MG PO CAPS: 10.0000 mg | ORAL_CAPSULE | Freq: Every day | ORAL | Status: DC

## 2011-03-10 NOTE — Assessment & Plan Note (Signed)
Refilled today. Discussed adherence to medication.

## 2011-03-10 NOTE — Patient Instructions (Signed)
You have a mild form of depression. I am restarting you on medication for this.   TAKE these medications EVERYDAY:  Prozac Prilosec Propranolol Aspirin  Take these medications AS NEEDED: Imitrex/sumatriptan for breakthrough headaches.  PLEASE CALL HERE IF YOU HAVE TO TAKE THIS MEDICATION EVERYDAY   MAKE AN APPOINTMENT TO SEE OUR PHARMACIST  MAKE SURE TO FOLLOW UP WITH THE GYNECOLOGIST  COME BACK TO SEE ME IN 3-4 WEEKS. CALL WITH ANY QUESTIONS.  Doree Albee MD

## 2011-03-10 NOTE — Assessment & Plan Note (Signed)
PHQ-9 score of 10. Pt noted to be tearful after completing. Refilled prozac today. Discussed psych red flags. WIll follow up in 3-4 weeks to reassess mood. Stressed importance of adherence to help with mood. Pt and family agreeable.

## 2011-03-10 NOTE — Assessment & Plan Note (Signed)
Worsening secondary to medical noncompliance. This is likely multifactorial with major contribution being likely language vs. Medical literacy. Pt formally referred to pharmacy clinic to help with medical reconciliation. Scheduled medication list written on AVS and explained to pt and daughter to help with understanding. Instructed pt to take propranolol as prescribed, but to contact us if develops any dizziness or weakness with this.

## 2011-03-10 NOTE — Assessment & Plan Note (Addendum)
Referred pt back to GYN. Discussed relative risk of OCP use in her age group in relation to VTE and CV events. Discussed with pt that birth control options would be better dicussed with a gynecologist. Pt states that she is agreeable to this.

## 2011-03-10 NOTE — Progress Notes (Signed)
  Subjective:    Patient ID: Pamela Stewart, female    DOB: 1970-07-30, 41 y.o.   MRN: [id number]  HPI Pt presents today for general follow up visit.  Partial translation done through pt's daughters who are also present.   Headaches: pt states that she has had recurrence of her baseline headaches over the last 3-4 months. Pt is currently on prophylactic propranolol for this. Pt states that she has not been taking this medication as prescribed. Pt states that she uses once to twice weekly whereas medication is written as TID. Pt is also on imitrex for breakthrough migraine treatment. Pt states that she has been taking this medication on a nightly basis. Pt reports that she has had some mild dizziness associated with this medication. Especially at night. Pt states that she take other medications at night at the same time as imitrex. Pt is unsure what medications she is taking at night with imitrex.   Mood: Pt and family also report pt with worsening sleep pattern and worsening mood. Both patient and family report pt with worsening mood. No HI or SI. Family states that there have been a lot of family stressors recently.   Contraception: Pt was previously referred to gynecology in setting of failed IUD placement to discuss birth control options. Pt was previously agreeable to this, however pt states that she did not follow up on this appointment. Pt states that she would like to just be on OCPs for birth control.   Review of Systems See HPI, otherwise ROS negative.     Objective:   Physical Exam Gen: up in chair, NAD HEENT: NCAT, EOMI, TMs clear bilaterally CV: RRR, no murmurs auscultated PULM: CTAB, no wheezes, rales, rhoncii ABD: S/NT/+ bowel sounds  EXT: 2+ peripheral pulses   Assessment & Plan:

## 2011-03-13 ENCOUNTER — Encounter: Payer: Self-pay | Admitting: *Deleted

## 2011-03-15 ENCOUNTER — Encounter: Payer: Self-pay | Admitting: Obstetrics & Gynecology

## 2011-04-07 ENCOUNTER — Ambulatory Visit: Payer: 59 | Admitting: Pharmacist

## 2011-04-14 ENCOUNTER — Encounter: Payer: 59 | Admitting: Obstetrics & Gynecology

## 2011-04-26 ENCOUNTER — Encounter: Payer: 59 | Admitting: Obstetrics & Gynecology

## 2011-04-28 ENCOUNTER — Ambulatory Visit: Payer: 59 | Admitting: Family Medicine

## 2011-06-23 ENCOUNTER — Encounter (HOSPITAL_COMMUNITY): Payer: Self-pay | Admitting: Emergency Medicine

## 2011-06-23 ENCOUNTER — Emergency Department (HOSPITAL_COMMUNITY)
Admission: EM | Admit: 2011-06-23 | Discharge: 2011-06-23 | Disposition: A | Discharge status: Home / Self Care | Payer: 59 | Attending: Emergency Medicine | Admitting: Emergency Medicine

## 2011-06-23 DIAGNOSIS — L509 Urticaria, unspecified: Secondary | ICD-10-CM | POA: Insufficient documentation

## 2011-06-23 MED ORDER — DIPHENHYDRAMINE HCL 25 MG PO TABS: 50.0000 mg | ORAL_TABLET | Freq: Three times a day (TID) | ORAL | Status: DC | PRN

## 2011-06-23 MED ORDER — FAMOTIDINE 20 MG PO TABS: 20.0000 mg | ORAL_TABLET | Freq: Two times a day (BID) | ORAL | Status: DC

## 2011-06-23 MED ORDER — DIPHENHYDRAMINE HCL 50 MG/ML IJ SOLN
50.0000 mg | Freq: Once | INTRAMUSCULAR | Status: AC
  Administered 2011-06-23: 50 mg via INTRAVENOUS
  Filled 2011-06-23: qty 1

## 2011-06-23 MED ORDER — PREDNISONE 10 MG PO TABS: 60.0000 mg | ORAL_TABLET | Freq: Every day | ORAL | Status: DC

## 2011-06-23 MED ORDER — METHYLPREDNISOLONE SODIUM SUCC 125 MG IJ SOLR
125.0000 mg | Freq: Once | INTRAMUSCULAR | Status: AC
  Administered 2011-06-23: 125 mg via INTRAVENOUS
  Filled 2011-06-23: qty 2

## 2011-06-23 MED ORDER — FAMOTIDINE IN NACL 20-0.9 MG/50ML-% IV SOLN
20.0000 mg | Freq: Once | INTRAVENOUS | Status: AC
  Administered 2011-06-23: 20 mg via INTRAVENOUS
  Filled 2011-06-23: qty 50

## 2011-06-23 NOTE — ED Notes (Signed)
PT. REPORTS PERSISTENT GENERALIZED ITCHY RASHES / HIVES FOR 2 WEEKS UNRELIEVED BY OTC BENADRYL AND HYDROCORTIZONE CREAM .

## 2011-06-23 NOTE — ED Provider Notes (Signed)
History     CSN: [id number]  Arrival date & time 06/23/11  0105   First MD Initiated Contact with Patient 06/23/11 0135      Chief Complaint  Patient presents with  . Urticaria    ) The history is provided by the patient and a relative.   the patient reports she's had hives for approximately 2 weeks.  Her hives are minimal during the day but when she comes home and begins working in the garden become severe.  She's tried Benadryl and hydrocortisone cream without improvement in her symptoms.  She denies lightheadedness weakness difficulty breathing or swallowing.  She has no history of asthma or eczema.  She's never really had allergies before.  She cannot think of new foods lotions or other possible exposures that would be new for her.  Her symptoms are mild to moderate at this time  History reviewed. No pertinent past medical history.  History reviewed. No pertinent past surgical history.  No family history on file.  History  Substance Use Topics  . Smoking status: Never Smoker   . Smokeless tobacco: Never Used  . Alcohol Use: No    OB History    Grav Para Term Preterm Abortions TAB SAB Ect Mult Living                  Review of Systems  All other systems reviewed and are negative.    Allergies  Review of patient's allergies indicates no known allergies.  Home Medications   Current Outpatient Rx  Name Route Sig Dispense Refill  . FLUOXETINE HCL 10 MG PO CAPS Oral Take 1 capsule (10 mg total) by mouth daily. 30 capsule 2  . OMEPRAZOLE 20 MG PO CPDR Oral Take 1 capsule (20 mg total) by mouth daily. 30 capsule 1  . PROPRANOLOL HCL 40 MG PO TABS Oral Take 2 tablets (80 mg total) by mouth 2 (two) times daily. 60 tablet 3  . SUMATRIPTAN SUCCINATE 50 MG PO TABS Oral Take 1 tablet (50 mg total) by mouth once as needed for migraine. 30 tablet 2  . DIPHENHYDRAMINE HCL 25 MG PO TABS Oral Take 2 tablets (50 mg total) by mouth every 8 (eight) hours as needed for itching. 20  tablet 0  . FAMOTIDINE 20 MG PO TABS Oral Take 1 tablet (20 mg total) by mouth 2 (two) times daily. 10 tablet 0  . PREDNISONE 10 MG PO TABS Oral Take 6 tablets (60 mg total) by mouth daily. 30 tablet 0    BP 148/82  Pulse 68  Temp(Src) 98.5 F (36.9 C) (Oral)  Resp 18  SpO2 100%  LMP 05/31/2011  Physical Exam  Nursing note and vitals reviewed. Constitutional: She is oriented to person, place, and time. She appears well-developed and well-nourished. No distress.  HENT:  Head: Normocephalic and atraumatic.       Oral airway is patent.  No lip or posterior pharyngeal swelling.  Speech is normal  Eyes: EOM are normal.  Neck: Normal range of motion.  Cardiovascular: Normal rate, regular rhythm and normal heart sounds.   Pulmonary/Chest: Effort normal and breath sounds normal.  Abdominal: Soft. She exhibits no distension. There is no tenderness.  Musculoskeletal: Normal range of motion.  Neurological: She is alert and oriented to person, place, and time.  Skin: Skin is warm and dry. Rash noted.       Diffuse urticaria throughout her legs chest back and arms  Psychiatric: She has a normal mood and  affect. Judgment normal.    ED Course  Procedures (including critical care time)  Labs Reviewed - No data to display No results found.   1. Urticaria       MDM  Patient presents with what appears to be urticaria without anaphylaxis or anaphylactic shock.  The patient responded quickly to Benadryl Pepcid and steroids.  The patient will be discharged home on the same.  No indication for epinephrine in the emergency department.  .  Nothing to suggest need to be sent home with epinephrine pen.  She understands return the emergency department for new or worsening symptoms        Lyanne Co, MD 06/23/11 [phone]

## 2011-07-06 ENCOUNTER — Encounter: Payer: Self-pay | Admitting: Family Medicine

## 2011-07-06 ENCOUNTER — Ambulatory Visit (INDEPENDENT_AMBULATORY_CARE_PROVIDER_SITE_OTHER): Payer: 59 | Admitting: Family Medicine

## 2011-07-06 VITALS — BP 154/100 | HR 69 | Temp 98.2°F | Ht 60.5 in | Wt 146.0 lb

## 2011-07-06 DIAGNOSIS — L509 Urticaria, unspecified: Secondary | ICD-10-CM

## 2011-07-06 DIAGNOSIS — I10 Essential (primary) hypertension: Secondary | ICD-10-CM

## 2011-07-06 LAB — COMPREHENSIVE METABOLIC PANEL
ALT: 39 U/L — ABNORMAL HIGH (ref 0–35)
AST: 22 U/L (ref 0–37)
CO2: 29 mEq/L (ref 19–32)
Chloride: 103 mEq/L (ref 96–112)
Sodium: 138 mEq/L (ref 135–145)
Total Bilirubin: 0.5 mg/dL (ref 0.3–1.2)
Total Protein: 6.8 g/dL (ref 6.0–8.3)

## 2011-07-06 LAB — LDL CHOLESTEROL, DIRECT: Direct LDL: 149 mg/dL — ABNORMAL HIGH

## 2011-07-06 LAB — CBC
HCT: 37.6 % (ref 36.0–46.0)
Hemoglobin: 12.5 g/dL (ref 12.0–15.0)
MCH: 25.1 pg — ABNORMAL LOW (ref 26.0–34.0)
MCHC: 33.2 g/dL (ref 30.0–36.0)
MCV: 75.5 fL — ABNORMAL LOW (ref 78.0–100.0)
Platelets: 233 10*3/uL (ref 150–400)
RBC: 4.98 MIL/uL (ref 3.87–5.11)
RDW: 15.1 % (ref 11.5–15.5)
WBC: 7.9 10*3/uL (ref 4.0–10.5)

## 2011-07-06 MED ORDER — TRIAMTERENE-HCTZ 37.5-25 MG PO TABS: 1.0000 | ORAL_TABLET | Freq: Every day | ORAL | Status: DC

## 2011-07-06 NOTE — Progress Notes (Signed)
Subjective:  Pt presents today for rash follow up.  Pt was seen in ED on 06/23/11 with noted urticarial reaction.  Pt was given IV solu-medrol as well as benadryl and pepcid.  Pt was placed on outpt oral antihistamine regimen.  Pt states that rash has since resolved.  Rash initially involved LEs as well as neck and head/face.  Rash was very pruritic in nature.  Pt is unsure of nidus for rash apart from new ?clothes/detergent.  Last antihistamine use was 1 week ago.   Pt also presents with elevated BP. Pt has had elevated BPs in the past.  + HA No CP, SOB.  No recent NSAID or triptan use per pt.  Pt does report high salt intake.  Pt also reports intermittent LE edema.       Review of Systems - Negative except as noted above in HPI.   Objective:  Current Outpatient Prescriptions  Medication Sig Dispense Refill  . diphenhydrAMINE (BENADRYL) 25 MG tablet Take 2 tablets (50 mg total) by mouth every 8 (eight) hours as needed for itching.  20 tablet  0  . famotidine (PEPCID) 20 MG tablet Take 1 tablet (20 mg total) by mouth 2 (two) times daily.  10 tablet  0  . FLUoxetine (PROZAC) 10 MG capsule Take 1 capsule (10 mg total) by mouth daily.  30 capsule  2  . omeprazole (PRILOSEC) 20 MG capsule Take 1 capsule (20 mg total) by mouth daily.  30 capsule  1  . predniSONE (DELTASONE) 10 MG tablet Take 6 tablets (60 mg total) by mouth daily.  30 tablet  0  . propranolol (INDERAL) 40 MG tablet Take 2 tablets (80 mg total) by mouth 2 (two) times daily.  60 tablet  3  . triamterene-hydrochlorothiazide (MAXZIDE-25) 37.5-25 MG per tablet Take 1 each (1 tablet total) by mouth daily.  90 tablet  3    Wt Readings from Last 3 Encounters:  07/06/11 146 lb (66.225 kg)  03/10/11 147 lb 5 oz (66.821 kg)  11/18/10 147 lb 3.2 oz (66.769 kg)   Temp Readings from Last 3 Encounters:  07/06/11 98.2 F (36.8 C) Oral  06/23/11 99 F (37.2 C) Oral  03/10/11 97.8 F (36.6 C) Oral   BP Readings from Last 3  Encounters:  07/06/11 154/100  06/23/11 139/80  03/10/11 150/107   Pulse Readings from Last 3 Encounters:  07/06/11 69  06/23/11 82  03/10/11 81    General: alert and cooperative HEENT: PERRLA and extra ocular movement intact Heart: S1, S2 normal, no murmur, rub or gallop, regular rate and rhythm Lungs: clear to auscultation, no wheezes or rales and unlabored breathing Abdomen: abdomen is soft without significant tenderness, masses, organomegaly or guarding Extremities: extremities normal, atraumatic, no cyanosis or edema Skin:mild <0.5 cm macular erythematous lesions, minimal, blanching  Neurology: normal without focal findings   Assessment/Plan:

## 2011-07-06 NOTE — Assessment & Plan Note (Signed)
Clinically resolved. Discussed RF avoidance. Will continue to follow.

## 2011-07-06 NOTE — Assessment & Plan Note (Signed)
Elevated today. Will formally start on maxzide for treatment. Will formally check labs including CBC, CMET, direct ldl and urine microalbumin.  DIscussed low salt diet.  Handout given.   Meds stopped: imitrex.

## 2011-07-06 NOTE — Patient Instructions (Signed)
Hypertension As your heart beats, it forces blood through your arteries. This force is your blood pressure. If the pressure is too high, it is called hypertension (HTN) or high blood pressure. HTN is dangerous because you may have it and not know it. High blood pressure may mean that your heart has to work harder to pump blood. Your arteries may be narrow or stiff. The extra work puts you at risk for heart disease, stroke, and other problems.  Blood pressure consists of two numbers, a higher number over a lower, 110/72, for example. It is stated as "110 over 72." The ideal is below 120 for the top number (systolic) and under 80 for the bottom (diastolic). Write down your blood pressure today. You should pay close attention to your blood pressure if you have certain conditions such as:  Heart failure.   Prior heart attack.   Diabetes   Chronic kidney disease.   Prior stroke.   Multiple risk factors for heart disease.  To see if you have HTN, your blood pressure should be measured while you are seated with your arm held at the level of the heart. It should be measured at least twice. A one-time elevated blood pressure reading (especially in the Emergency Department) does not mean that you need treatment. There may be conditions in which the blood pressure is different between your right and left arms. It is important to see your caregiver soon for a recheck. Most people have essential hypertension which means that there is not a specific cause. This type of high blood pressure may be lowered by changing lifestyle factors such as:  Stress.   Smoking.   Lack of exercise.   Excessive weight.   Drug/tobacco/alcohol use.   Eating less salt.  Most people do not have symptoms from high blood pressure until it has caused damage to the body. Effective treatment can often prevent, delay or reduce that damage. TREATMENT  When a cause has been identified, treatment for high blood pressure is  directed at the cause. There are a large number of medications to treat HTN. These fall into several categories, and your caregiver will help you select the medicines that are best for you. Medications may have side effects. You should review side effects with your caregiver. If your blood pressure stays high after you have made lifestyle changes or started on medicines,   Your medication(s) may need to be changed.   Other problems may need to be addressed.   Be certain you understand your prescriptions, and know how and when to take your medicine.   Be sure to follow up with your caregiver within the time frame advised (usually within two weeks) to have your blood pressure rechecked and to review your medications.   If you are taking more than one medicine to lower your blood pressure, make sure you know how and at what times they should be taken. Taking two medicines at the same time can result in blood pressure that is too low.  SEEK IMMEDIATE MEDICAL CARE IF:  You develop a severe headache, blurred or changing vision, or confusion.   You have unusual weakness or numbness, or a faint feeling.   You have severe chest or abdominal pain, vomiting, or breathing problems.  MAKE SURE YOU:   Understand these instructions.   Will watch your condition.   Will get help right away if you are not doing well or get worse.  Document Released: 01/02/2005 Document Revised: 12/22/2010 Document Reviewed:   08/23/2007 Exit[employer] Patient Information 2012 [employer], [place].  1.5 Gram Low Sodium Diet A 1.5 gram sodium diet restricts the amount of sodium in the diet to no more than 1.5 g or 1500 mg daily. The American Heart Association recommends Americans over the age of 2 to consume no more than 1500 mg of sodium each day to reduce the risk of developing high blood pressure. Research also shows that limiting sodium may reduce heart attack and stroke risk. Many foods contain sodium for flavor and sometimes as  a preservative. When the amount of sodium in a diet needs to be low, it is important to know what to look for when choosing foods and drinks. The following includes some information and guidelines to help make it easier for you to adapt to a low sodium diet. QUICK TIPS  Do not add salt to food.   Avoid convenience items and fast food.   Choose unsalted snack foods.   Buy lower sodium products, often labeled as "lower sodium" or "no salt added."   Check food labels to learn how much sodium is in 1 serving.   When eating at a restaurant, ask that your food be prepared with less salt or none, if possible.  READING FOOD LABELS FOR SODIUM INFORMATION The nutrition facts label is a good place to find how much sodium is in foods. Look for products with no more than 400 mg of sodium per serving. Remember that 1.5 g = 1500 mg. The food label may also list foods as:  Sodium-free: Less than 5 mg in a serving.   Very low sodium: 35 mg or less in a serving.   Low-sodium: 140 mg or less in a serving.   Light in sodium: 50% less sodium in a serving. For example, if a food that usually has 300 mg of sodium is changed to become light in sodium, it will have 150 mg of sodium.   Reduced sodium: 25% less sodium in a serving. For example, if a food that usually has 400 mg of sodium is changed to reduced sodium, it will have 300 mg of sodium.  CHOOSING FOODS Grains  Avoid: Salted crackers and snack items. Some cereals, including instant hot cereals. Bread stuffing and biscuit mixes. Seasoned rice or pasta mixes.   Choose: Unsalted snack items. Low-sodium cereals, oats, puffed wheat and rice, shredded wheat. English muffins and bread. Pasta.  Meats  Avoid: Salted, canned, smoked, spiced, pickled meats, including fish and poultry. Bacon, ham, sausage, cold cuts, hot dogs, anchovies.   Choose: Low-sodium canned tuna and salmon. Fresh or frozen meat, poultry, and fish.  Dairy  Avoid: Processed cheese  and spreads. Cottage cheese. Buttermilk and condensed milk. Regular cheese.   Choose: Milk. Low-sodium cottage cheese. Yogurt. Sour cream. Low-sodium cheese.  Fruits and Vegetables  Avoid: Regular canned vegetables. Regular canned tomato sauce and paste. Frozen vegetables in sauces. Olives. Rosita Fire. Relishes. Sauerkraut.   Choose: Low-sodium canned vegetables. Low-sodium tomato sauce and paste. Frozen or fresh vegetables. Fresh and frozen fruit.  Condiments  Avoid: Canned and packaged gravies. Worcestershire sauce. Tartar sauce. Barbecue sauce. Soy sauce. Steak sauce. Ketchup. Onion, garlic, and table salt. Meat flavorings and tenderizers.   Choose: Fresh and dried herbs and spices. Low-sodium varieties of mustard and ketchup. Lemon juice. Tabasco sauce. Horseradish.  SAMPLE 1.5 GRAM SODIUM MEAL PLAN Breakfast / Sodium (mg)  1 cup low-fat milk / 143 mg   1 whole-wheat English muffin / 240 mg   1 tbs heart-healthy  margarine / 153 mg   1 hard-boiled egg / 139 mg   1 small orange / 0 mg  Lunch / Sodium (mg)  1 cup raw carrots / 76 mg   2 tbs no salt added peanut butter / 5 mg   2 slices whole-wheat bread / 270 mg   1 tbs jelly / 6 mg    cup red grapes / 2 mg  Dinner / Sodium (mg)  1 cup whole-wheat pasta / 2 mg   1 cup low-sodium tomato sauce / 73 mg   3 oz lean ground beef / 57 mg   1 small side salad (1 cup raw spinach leaves,  cup cucumber,  cup yellow bell pepper) with 1 tsp olive oil and 1 tsp red wine vinegar / 25 mg  Snack / Sodium (mg)  1 container low-fat vanilla yogurt / 107 mg   3 graham cracker squares / 127 mg  Nutrient Analysis  Calories: 1745   Protein: 75 g   Carbohydrate: 237 g   Fat: 57 g   Sodium: 1425 mg  Document Released: 01/02/2005 Document Revised: 12/22/2010 Document Reviewed: 04/05/2009 [facility] Patient Information 2012 [place], [place].

## 2011-07-07 LAB — MICROALBUMIN / CREATININE URINE RATIO: Microalb Creat Ratio: 3.7 mg/g (ref 0.0–30.0)

## 2011-07-13 ENCOUNTER — Ambulatory Visit: Payer: 59 | Admitting: Family Medicine

## 2011-08-01 ENCOUNTER — Ambulatory Visit (INDEPENDENT_AMBULATORY_CARE_PROVIDER_SITE_OTHER): Payer: 59 | Admitting: Emergency Medicine

## 2011-08-01 ENCOUNTER — Encounter: Payer: Self-pay | Admitting: Emergency Medicine

## 2011-08-01 VITALS — BP 114/75 | HR 78 | Ht 65.0 in | Wt 145.0 lb

## 2011-08-01 DIAGNOSIS — E785 Hyperlipidemia, unspecified: Secondary | ICD-10-CM

## 2011-08-01 DIAGNOSIS — I10 Essential (primary) hypertension: Secondary | ICD-10-CM

## 2011-08-01 LAB — COMPREHENSIVE METABOLIC PANEL
ALT: 22 U/L (ref 0–35)
Albumin: 3.9 g/dL (ref 3.5–5.2)
CO2: 28 mEq/L (ref 19–32)
Calcium: 9 mg/dL (ref 8.4–10.5)
Chloride: 104 mEq/L (ref 96–112)
Glucose, Bld: 99 mg/dL (ref 70–99)
Sodium: 140 mEq/L (ref 135–145)
Total Bilirubin: 0.4 mg/dL (ref 0.3–1.2)
Total Protein: 6.6 g/dL (ref 6.0–8.3)

## 2011-08-01 NOTE — Progress Notes (Signed)
  Subjective:    Patient ID: Pamela Stewart, female    DOB: [birth year], 41 y.o.   MRN: [id number]  HPI Pamela Stewart is here for f/u of HTN.  Hypertension Well controlled: yes Compliant with medication: yes Side effects from medication: no Check BP at home: no  Chest pain: no Palpitations: no Vision changes: no Leg edema: no Dizziness: no  Hyperlipidemia No family history of heart diease.  Working on cutting out salt and fatty foods.   I have reviewed and updated the following as appropriate: allergies and current medications SHx: never smoker  Review of Systems See HPI    Objective:   Physical Exam BP 114/75  Pulse 78  Ht 5\' 5"  (1.651 m)  Wt 145 lb (65.772 kg)  BMI 24.13 kg/m2  LMP 07/25/2011 Gen: alert, cooperative, NAD HEENT: AT/[place], sclera white, MMM, PERRL CV: RRR, no murmurs Pulm: CTAB, no wheezes or rales Ext: no edema     Assessment & Plan:

## 2011-08-01 NOTE — Patient Instructions (Signed)
It was nice to meet you!  Your blood pressure is excellent with the new medicine.  We are going to check your electrolytes.  I will call if anything is wrong.  Please continue to avoid salt and fatty/fried foods.  This will help both your cholesterol and blood pressure.   I would like to see you back in 3 months to check on your blood pressure and check your cholesterol.  Please do not eat or drink anything for the 8 hours prior to this appointment.

## 2011-08-01 NOTE — Assessment & Plan Note (Signed)
Well controlled on Maxide.  Will check CMP.

## 2011-08-01 NOTE — Assessment & Plan Note (Signed)
LDL 149.  Framingham score of <1% with estimates of total and HDL cholesterol.  Only risk factor is hypertension.  Goal is <160.

## 2011-10-06 ENCOUNTER — Ambulatory Visit (INDEPENDENT_AMBULATORY_CARE_PROVIDER_SITE_OTHER): Payer: Self-pay | Admitting: *Deleted

## 2011-10-06 DIAGNOSIS — Z23 Encounter for immunization: Secondary | ICD-10-CM

## 2012-01-24 ENCOUNTER — Ambulatory Visit: Payer: Self-pay

## 2012-01-29 ENCOUNTER — Ambulatory Visit: Payer: Self-pay | Admitting: Family Medicine

## 2012-02-20 ENCOUNTER — Ambulatory Visit: Payer: Self-pay | Admitting: Family Medicine

## 2012-02-21 ENCOUNTER — Ambulatory Visit: Payer: Self-pay | Admitting: Family Medicine

## 2012-03-08 ENCOUNTER — Ambulatory Visit (INDEPENDENT_AMBULATORY_CARE_PROVIDER_SITE_OTHER): Payer: 59 | Admitting: Emergency Medicine

## 2012-03-08 ENCOUNTER — Encounter: Payer: Self-pay | Admitting: Emergency Medicine

## 2012-03-08 VITALS — BP 136/88 | HR 90 | Ht 65.0 in | Wt 151.0 lb

## 2012-03-08 DIAGNOSIS — M7711 Lateral epicondylitis, right elbow: Secondary | ICD-10-CM

## 2012-03-08 NOTE — Progress Notes (Signed)
  Subjective:    Patient ID: Pamela Stewart, female    DOB: 03-Aug-1970, 42 y.o.   MRN: [id number]  HPI Pamela Stewart is here for right arm and leg pain.  Right leg pain Reports having pain in her right lower back that radiates into the right leg for a year, but the pain is currently mostly in her anterior shin and top of her foot.  Does have some associated numbness.  Toes sometimes twitch a little.  Worse with prolonged standing.  Feels like the foot has to "wake up in the mornings."  Better with massage of the anterior shin.  Right arm pain Located over the lateral elbow, present for about 1 month.  She has a job that involves repetitive elbow and wrist extension.  No weakness or numbness.  Worse after work.  Better if she has a day off.  I have reviewed and updated the following as appropriate: allergies, current medications, past family history, past medical history, past social history, past surgical history and problem list SHx: never smoker  Review of Systems See HPI    Objective:   Physical Exam BP 136/88  Pulse 90  Ht 5\' 5"  (1.651 m)  Wt 151 lb (68.493 kg)  BMI 25.13 kg/m2  LMP 02/28/2012 Gen: alert, cooperative, NAD HEENT: AT/[place], sclera white, MMM Neck: supple CV: RRR, no murmurs Pulm: CTAB, no wheezes or rales Ext: no edema, 2+ DP and radial pulses bilaterally Right Elbow: no erythema or edema, tender to palpation over lateral epicondyle and proximal wrist extensors, full ROM, pain at lateral epicondyle with resisted wrist extension Right Shin: no erythema or edema; full ROM in ankle, tender to palpation along anterior tibialis, pain with resisted dorsiflexion      Assessment & Plan:

## 2012-03-08 NOTE — Assessment & Plan Note (Signed)
Will treat conservatively with scheduled ibuprofen 600mg  TID, ice, and rest.  Handout on rehab exercises given.  Follow up in 2-4 weeks if no improvement.  Could consider injection at that time.

## 2012-03-08 NOTE — Assessment & Plan Note (Signed)
Well controlled 

## 2012-03-08 NOTE — Assessment & Plan Note (Signed)
Conservative treatment with ice and ibuprofen.  Handout on rehab exercises given.

## 2012-03-08 NOTE — Patient Instructions (Addendum)
It was nice to see you! You have Tennis Elbow and anterior shin strain. Please do the exercises on the handouts once a day. Take ibuprofen 600mg  (3 tablets) 3 times a day for the next week. Rest your right elbow as much as you can. If it is not getting better in the next 2 weeks, please come back.

## 2012-06-18 ENCOUNTER — Other Ambulatory Visit: Payer: Self-pay | Admitting: Family Medicine

## 2012-07-26 ENCOUNTER — Other Ambulatory Visit: Payer: Self-pay | Admitting: Family Medicine

## 2012-08-02 ENCOUNTER — Encounter: Payer: Self-pay | Admitting: Family Medicine

## 2012-08-02 ENCOUNTER — Ambulatory Visit (HOSPITAL_COMMUNITY)
Admission: RE | Admit: 2012-08-02 | Discharge: 2012-08-02 | Disposition: A | Discharge status: Home / Self Care | Payer: 59 | Source: Ambulatory Visit | Attending: Family Medicine | Admitting: Family Medicine

## 2012-08-02 ENCOUNTER — Ambulatory Visit (INDEPENDENT_AMBULATORY_CARE_PROVIDER_SITE_OTHER): Payer: 59 | Admitting: Family Medicine

## 2012-08-02 VITALS — BP 131/70 | HR 62 | Temp 99.2°F | Ht 65.0 in | Wt 147.0 lb

## 2012-08-02 DIAGNOSIS — M47817 Spondylosis without myelopathy or radiculopathy, lumbosacral region: Secondary | ICD-10-CM | POA: Insufficient documentation

## 2012-08-02 DIAGNOSIS — M549 Dorsalgia, unspecified: Secondary | ICD-10-CM

## 2012-08-02 DIAGNOSIS — K219 Gastro-esophageal reflux disease without esophagitis: Secondary | ICD-10-CM

## 2012-08-02 DIAGNOSIS — M545 Low back pain, unspecified: Secondary | ICD-10-CM | POA: Insufficient documentation

## 2012-08-02 DIAGNOSIS — M5430 Sciatica, unspecified side: Secondary | ICD-10-CM

## 2012-08-02 DIAGNOSIS — M79609 Pain in unspecified limb: Secondary | ICD-10-CM | POA: Insufficient documentation

## 2012-08-02 DIAGNOSIS — M5432 Sciatica, left side: Secondary | ICD-10-CM

## 2012-08-02 DIAGNOSIS — A048 Other specified bacterial intestinal infections: Secondary | ICD-10-CM | POA: Insufficient documentation

## 2012-08-02 LAB — POCT H PYLORI SCREEN: H Pylori Screen, POC: POSITIVE

## 2012-08-02 MED ORDER — CLARITHROMYCIN 500 MG PO TABS: 500.0000 mg | ORAL_TABLET | Freq: Two times a day (BID) | ORAL | Status: DC

## 2012-08-02 MED ORDER — AMOXICILLIN 500 MG PO CAPS: 1000.0000 mg | ORAL_CAPSULE | Freq: Two times a day (BID) | ORAL | Status: DC

## 2012-08-02 MED ORDER — CYCLOBENZAPRINE HCL 5 MG PO TABS: 5.0000 mg | ORAL_TABLET | Freq: Three times a day (TID) | ORAL | Status: DC | PRN

## 2012-08-02 MED ORDER — OMEPRAZOLE 40 MG PO CPDR: 40.0000 mg | DELAYED_RELEASE_CAPSULE | Freq: Every day | ORAL | Status: DC

## 2012-08-02 NOTE — Assessment & Plan Note (Addendum)
Symptoms and physical exam correspond with sciatic. Her x-ray 2012 for similar condition and right only show transitional anatomy with sacralized L5 level based on the findings on the 2008 thoracic radiographs. Normal pulses no weakness or sensorial deficit. Plan X-ray of lumbar spine, symptomatic treatment (no oral anti-inflammatories given since patient has also GERD with positive H. Pylori that we start treating at this visit) Followup result with PCP

## 2012-08-02 NOTE — Patient Instructions (Addendum)
B?nh Tro Ng??c D? Dy Th?c Qu?n, Ng??i L?n  (Gastroesophageal Reflux Diseaes, Adult)  B?nh tro ng??c d? dy th?c qu?n (GERD) x?y ra khi axit t? d? dy tro ln th?c qu?n. Khi axit ti?p xc v?i th?c qu?n, axit gy ra ?au (vim) trong th?c qu?n. Theo th?i gian, GERD c th? t?o ra cc l? nh? (lot) ? nim m?c th?c qu?n. NGUYN NHN   Tr?ng l??ng c? th? t?ng. ?i?u ny ??t p l?c ln d? dy, lm t?ng axit t? d? dy vo th?c qu?n.  Ht thu?c l. Ht thu?c l lm t?ng l??ng axit ???c s?n sinh trong d? dy.  U?ng r??u. U?ng r??u lm gi?m p l?c trong c? th?t th?c qu?n d??i (van ho?c vnh c?a c? gi?a th?c qu?n v d? dy), cho php axit t? d? dy vo th?c qu?n.  ?n t?i mu?n v b?ng no. Tnh tr?ng ny lm t?ng p l?c c?ng nh? l??ng axit ???c s?n sinh trong d? dy.  D? t?t c? th?t th?c qu?n d??i. ?i khi khng tm th?y nguyn nhn.  TRI?U CH?NG   ?au rt ? ph?n d??i gi?a ng?c pha sau x??ng ?c v trong khu v?c gi?a d? dy. Hi?n t??ng ny c th? x?y ra hai l?n m?t tu?n ho?c th??ng xuyn h?n.  Kh nu?t.  ?au h?ng.  Ho khan.  Cc tri?u ch?ng gi?ng hen suy?n, bao g?m t?c ng?c, th? d?c ho?c th? kh kh. CH?N ?ON  Chuyn gia ch?m [place] y t? c th? ch?n ?on GERD d?a trn cc tri?u ch?ng c?a b?n. Trong m?t s? tr??ng h?p, ch?p X quang v cc xt nghi?m khc c th? ???c ti?n hnh ?? ki?m tra cc bi?n ch?ng ho?c tnh tr?ng c?a d? dy v th?c qu?n.  ?I?U TR?  Chuyn gia ch?m [place] y t? c th? ?? xu?t thu?c mua tr?c ti?p t?i hi?u thu?c ho?c thu?c theo toa ?? gip gi?m l??ng axit ???c s?n sinh. Hy h?i chuyn gia ch?m [place] y t? c?a b?n tr??c khi b?t ??u ho?c thm b?t k? lo?i thu?c m?i no.  H??NG D?N CH?M [place] T?I NH   Thay ??i cc y?u t? m b?n c th? ki?m sot. H?i chuyn gia ch?m  y t? ?? ???c h??ng d?n v? vi?c gi?m cn, b? thu?c l v r??u.  [place] cc lo?i th?c ph?m v ?? u?ng lm cho cc tri?u ch?ng t?i t? h?n, ch?ng h?n nh?:  Caffeine ho?c ?? u?ng c c?n.  S c la.  B?c h ho?c v? b?c  h.  T?i v hnh ty.  Th?c ?n [place].  Tri cy h? cam, ch?ng h?n nh? cam, chanh hay chanh ty.  Cc th?c ?n trn c? s? c chua, ch?ng h?n nh? n??c x?t, ?t, salsa (n??c x?t [place]) v bnh pizza.  Cc lo?i th?c ?n chin xo v nhi?u ch?t bo.  Trnh n?m xu?ng ng? 3 ti?ng tr??c gi? ?i ng? ho?c tr??c khi c m?t gi?c ng? ng?n.  ?n nh?ng b?a ?n nh?, th??ng xuyn h?n thay v cc b?a ?n l?n.  M?c qu?n o r?ng. Khng ?eo b?t c? th? g ch?t quanh th?t l?ng gy p l?c ln d? dy.  Nng ??u gi??ng cao ln t? 6 ??n 8 inch b?ng cc kh?i g? ?? gip b?n ng?. S? d?ng thm g?i s? khng c tc d?ng.  Ch? s? d?ng thu?c mua tr?c ti?p t?i hi?u thu?c ho?c thu?c theo toa ?? gi?m ?au, gi?m s? kh ch?u ho?c h? s?t theo ch?  d?n c?a chuyn gia ch?m [place] y t? c?a b?n.  Khng dng thu?c atpirin, ibuprofen ho?c cc thu?c ch?ng vim khng c steroid (NSAID) khc. HY NGAY L?P T?C THAM V?N V?I CHUYN GIA Y T? N?U:   B?n b? ?au ? cnh tay, c?, hm, r?ng ho?c l?ng.  Hi?n t??ng ?au t?ng ln ho?c thay ??i theo c??ng ?? ho?c th?i gian.  B?n b? bu?n nn, nn ho?c ?? m? hi (tot m? hi).  B?n b? th? d?c ho?c ng?t x?u.  Ch?t nn c mu xanh l cy, vng, ?en ho?c trng gi?ng nh? b c ph ho?c mu.  Phn c mu ??, ?? nh? mu ho?c ?en. Nh?ng tri?u ch?ng ny c th? l d?u hi?u c?a cc v?n ?? khc, ch?ng h?n nh? b?nh tim, ch?y mu d? dy ho?c ch?y mu th?c qu?n.  ??M B?O B?N:   Hi?u cc h??ng d?n ny.  S? theo di tnh tr?ng c?a mnh.  S? yu c?u tr? gip ngay l?p t?c n?u b?n c?m th?y khng kh?e ho?c tnh tr?ng tr? nn t?i h?n. Document Released: 10/12/2004 Document Revised: 03/27/2011 [facility] Patient Information 2014 [place], [place].   ?au Th?n Kinh T?a  (Sciatica)  ?au th?n kinh t?a l ?au, y?u, t ho?c ng?a ran d?c theo ???ng ?i c?a dy th?n kinh hng. Dy th?n kinh b?t ??u ? l?ng d??i v ch?y xu?ng pha sau m?i chn. Dy th?n kinh ny ?i?u khi?n cc c? ? c?ng chn v ? m?t sau c?a ??u g?i, trong khi c?ng  cung c?p c?m gic cho m?t sau c?a ?i, c?ng chn v bn chn. ?au th?n kinh t?a l tri?u ch?ng c?a m?t ch?ng b?nh khc. V d?, t?n th??ng th?n kinh ho?c m?t s? tnh tr?ng c? th?, ch?ng h?n nh? thot v? ??a ??m ho?c c?a x??ng trn c?t s?ng, k?p ho?c p l?c ln dy th?n kinh hng. ?i?u ny gy ?au, m?t m?i ho?c nh?ng c?m gic khc, th??ng k?t h?p v?i ?au th?n kinh t?a. Ni chung, ?au th?n kinh t?a ch? ?nh h??ng ??n m?t bn c?a c? th?. NGUYN NHN   Thot v? ho?c tr??t ??a ??m.  B?nh thoi ha ??a ??m.  R?i lo?n ?au lin quan ??n c? h?p ? mng (h?i ch?ng piriformis).  Ch?n th??ng ho?c gy x??ng vng ch?u.  [place].  Kh?i u (hi?m). TRI?U CH?NG  Cc tri?u ch?ng c th? khc nhau t? nh? ??n r?t n?ng. Cc tri?u ch?ng th??ng ?i t? th?t l?ng xu?ng mng v xu?ng m?t sau c?a chn. Cc tri?u ch?ng c th? bao g?m:   Ng?a nh? ho?c ?au m ? ? l?ng d??i, chn ho?c hng.  T ? m?t sau c?a b?p chn ho?c bn chn.  C?m gic nng rt ? l?ng d??i, chn ho?c hng.  ?au nhi ? l?ng d??i, chn ho?c hng.  Y?u chn.  ?au l?ng nghim tr?ng gy kh kh?n cho vi?c ?i l?i. Cc tri?u ch?ng ny c th? t?i t? h?n km theo ho, h?t h?i, c??i ho?c ng?i ho?c ??ng ko di. Ngoi ra, bo ph c th? lm tr?m tr?ng thm cc tri?u ch?ng.  CH?N ?ON  Chuyn gia ch?m [place] y t? s? khm tr?c ti?p ?? tm cc tri?u ch?ng ph? bi?n c?a ?au th?n kinh t?a. Chuyn gia ch?m [place] y t? c th? yu c?u b?n th?c hi?n nh?ng ??ng tc ho?c ho?t ??ng nh?t ??nh s? kch ho?t ?au dy th?n kinh hng. Cc xt nghi?m khc c th? ???c th?c hi?n ??  tm ra nguyn nhn ?au th?n kinh t?a. Cc bi t?p ny c th? bao g?m:   Xt nghi?m mu.  Ch?p X quang.  Xt nghi?m hnh ?nh, ch?ng h?n nh? MRI ho?c ch?p CT. ?I?U TR?  ?i?u tr? ???c h??ng vo nguyn nhn gy ?au th?n kinh t?a. ?i khi, khng c?n ?i?u tr?, c?n ?au v s? kh ch?u s? t? h?t. N?u c?n ?i?u tr?, chuyn gia ch?m [place] y t? c th? ?? ngh?:   Thu?c gi?m ?au mua tr?c ti?p t?i hi?u thu?c.  Thu?c k  toa, ch?ng h?n nh? thu?c khng vim, gin c? ho?c thu?c ng?.  Ch??m nng ho?c ? vo ch? ?au.  Tim steroid ?? gi?m ?au, kch ?ng v vim quanh dy th?n kinh.  Gi?m ho?t ??ng trong th?i gian ?au.  T?p th? d?c v ko c?ng ?? t?ng c??ng b?ng v c?i thi?n tnh linh ho?t c?a c?t s?ng. Chuyn gia ch?m [place] y t? c th? ?? ngh? gi?m cn n?u th?a cn khi?n ?au l?ng t?i t? h?n.  V?t l tr? li?u.  Ph?u thu?t ?? lo?i b? nh?ng g ?ang gy s?c p ho?c k?p dy th?n kinh, ch?ng h?n nh? c?a x??ng ho?c m?t ph?n c?a thot v? ??a ??m. H??NG D?N CH?M [place] T?I NH   Ch? s? d?ng thu?c mua tr?c ti?p t?i hi?u thu?c ho?c thu?c theo toa ?? gi?m ?au ho?c gi?m s? kh ch?u theo ch? d?n c?a chuyn gia ch?m [place] y t? c?a b?n.  Ch??m ? vo khu v?c b? ?nh h??ng trong 20 pht, 3-4 l?n m?i ngy trong 48-72 gi? ??u tin. Sau ? th? ch??m nng theo cng cch.  T?p th? d?c, ko c?ng ho?c th?c hi?n cc ho?t ??ng thng th??ng c?a b?n n?u nh?ng ho?t ??ng ny khng lm tr?m tr?ng thm c?n ?au c?a b?n.  Tham d? cc bu?i v?t l tr? li?u theo ch? d?n c?a chuyn gia ch?m [place] y t?.  Gi? m?i cu?c h?n khm l?i theo ch? d?n c?a chuyn gia ch?m [place] y t?.  Khng mang giy cao gt ho?c giy khng h? tr? thch h?p.  Ki?m tra n?m c?a b?n xem n c qu m?m khng. N?m ch?c c th? gi?m ?au v s? kh ch?u c?a b?n. HY NGAY L?P T?C THAM V?N V?I CHUYN GIA Y T? N?U:   B?n b? m?t ki?m sot ru?t ho?c bng quang (khng th? gi? ????c).  B?n b? y?u gia t?ng ? l?ng d??i, x??ng ch?u, mng v chn.  B?n c b? t?y ?? ho?c s?ng ? l?ng.  B?n c c?m gic nng rt khi ?i ti?u.  B?n b? ?au n?ng h?n khi n?m xu?ng ho?c khi t?nh gi?c vo ban ?m.  C?n ?au t?i t? h?n so v?i c? ?au b?n b? trong qu kh?.  C?n ?au ko di h?n 4 tu?n.  B?n ??t nhin gi?m cn m khng c l do. ??M B?O B?N:   Hi?u cc h??ng d?n ny.  S? theo di tnh tr?ng c?a mnh.  S? yu c?u tr? gip ngay l?p t?c n?u b?n c?m th?y khng kh?e ho?c tnh tr?ng tr? nn t?i  h?n. Document Released: 07/04/2011 [facility] Patient Information 2014 [place], [place].  These instructions have been discussed with interpreter before issuing to the patient.

## 2012-08-02 NOTE — Progress Notes (Signed)
Family Medicine Office Visit Note   Subjective:   Patient ID: Pamela Stewart, female  DOB: August 08, 1970, 42 y.o.. MRN: [id number]   [place] interpreter present in the room during visit. Pt that comes today for same day appointment complaining of the following;  #1 lower back pain and left leg pain with numbness when standing for long periods of time exacerbated in the last couple of months. Last year pt has same symptoms on her right side that resolved with exercise and oral antiinflammatory treatment. She reports her symptoms go from mid aspect of gluteal area run though back of leg all the way to her toes ( mostly 3,4,5 toes). Denies focalized weakness.   #2 stomach pain and heartburn that is worse when stomach is empty and interferes with sleep. Stomach pain is burning in nature in the upper abdominal area. Denies dark stools, nausea, vomiting, fever or chills. Review of Systems:  Pt denies SOB, chest pain, palpitations, headaches, dizziness, numbness or weakness. No changes on urinary or BM habits. No unintentional weigh loss/gain.  Objective:   Physical Exam: Gen:  NAD HEENT: Moist mucous membranes  CV: Regular rate and rhythm, no murmurs rubs or gallops PULM: Clear to auscultation bilaterally. No wheezes/rales/rhonchi ABD: Soft, mildly tender in epigastric area. No rebound tenderness, no guarding. Non distended, normal bowel sounds.  MSK:  Back - Normal skin, Spine with normal alignment and no deformity.  No tenderness to vertebral process palpation.  Paraspinous muscles are not tender and without spasm.   Range of motion is full at neck and lumbar sacral regions. Straight leg raise is positive on the left. Normal pulses.  Neuro: Alert and oriented x3. No focalization  Assessment & Plan:

## 2012-08-02 NOTE — Assessment & Plan Note (Signed)
History of GERD. Tenuis symptoms regarding H2 blocker treatment. Plan H. pylori is positive. Triple therapy is prescribed and discussed this patient with the help of interpreter

## 2012-08-14 ENCOUNTER — Ambulatory Visit (INDEPENDENT_AMBULATORY_CARE_PROVIDER_SITE_OTHER): Payer: 59 | Admitting: Emergency Medicine

## 2012-08-14 ENCOUNTER — Encounter: Payer: Self-pay | Admitting: Emergency Medicine

## 2012-08-14 VITALS — BP 118/84 | HR 78 | Ht 61.0 in | Wt 146.0 lb

## 2012-08-14 DIAGNOSIS — A048 Other specified bacterial intestinal infections: Secondary | ICD-10-CM

## 2012-08-14 DIAGNOSIS — M549 Dorsalgia, unspecified: Secondary | ICD-10-CM

## 2012-08-14 MED ORDER — MELOXICAM 15 MG PO TABS: 15.0000 mg | ORAL_TABLET | Freq: Every day | ORAL | Status: DC

## 2012-08-14 NOTE — Assessment & Plan Note (Signed)
Completed treatment. Symptoms much improved.

## 2012-08-14 NOTE — Progress Notes (Signed)
Subjective:     Patient ID: Pamela Stewart, female   DOB: [birth year], 42 y.o.   MRN: [id number]  HPI Patient seen today for persistent burning pain and numbness down the posterior aspect of her left leg. These symptoms have been present for about 1 month, and she has had similar symptoms in the right leg in the past. She states the pain is worse with standing or laying down on her back and better with sitting, but in all positions she still feels the numbness that travels all the way into her toes. She has had X-rays in the past that have shown L5 sacralization, resulting in only 4 weight bearing lumbar, and only mild facet arthropathy in her lumbar vertebrae. She denies any N/B/T in any of her other extremities, but does admit to pain in her lower back especially on the left side. She was present with a [place] interpreter.    Review of Systems ROS was negative except for what is discussed in the HPI.     Objective:   Physical Exam  Cardiovascular: Normal rate and regular rhythm.  Exam reveals no gallop and no friction rub.   No murmur heard. Pulmonary/Chest: Breath sounds normal. She has no wheezes. She has no rales.  Musculoskeletal:  Paraspinal tenderness and hypertonicity on the left side of L1-L4. Point tenderness present over left piriformis, accompanied by intensification of her symptoms down her left leg when pushing on piriformis.   Neurological: She is alert. She has normal reflexes.  Strength slightly diminished on left, most likely inhibited by pain due to patient's guarding nature.   Sensation diminished in all locations tested on left lower extremity compared to right.   Straight leg raise test was negative.    BP 118/84  Pulse 78  Ht 5\' 1"  (1.549 m)  Wt 146 lb (66.225 kg)  BMI 27.6 kg/m2  LMP 06/30/2012     Assessment:     1. Left Piriformis syndrome  2. GERD secondary to H. pylori infection    Plan:     1. Left Piriformis syndrome: Stretches and exercises to  relax the piriformis were provided to patient.  Patient also prescribed meloxicam 15 mg PO daily for pain. Patient to follow up in two weeks to check on improvement.   2. GERD, secondary to H. Pylori infection: Symptoms have greatly improved. Continue taking omeprazole 40 mg daily.

## 2012-08-14 NOTE — Assessment & Plan Note (Signed)
Like left piriformis syndrome with sciatica. Meloxicam 15mg  daily x2 weeks. Exercises given. 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 Follow up in 2 weeks - can cancel if improved.

## 2012-08-14 NOTE — Patient Instructions (Addendum)
It was nice to see you!  Take omeprazole 1 pill daily. Take meloxicam 1 pill daily for 2 weeks. Do the exercises daily.  Follow up with me in 2 weeks.

## 2012-09-05 ENCOUNTER — Ambulatory Visit (INDEPENDENT_AMBULATORY_CARE_PROVIDER_SITE_OTHER): Payer: 59 | Admitting: Emergency Medicine

## 2012-09-05 VITALS — BP 125/90 | HR 112 | Temp 98.8°F | Ht 65.0 in | Wt 147.0 lb

## 2012-09-05 DIAGNOSIS — G5702 Lesion of sciatic nerve, left lower limb: Secondary | ICD-10-CM

## 2012-09-05 DIAGNOSIS — I10 Essential (primary) hypertension: Secondary | ICD-10-CM

## 2012-09-05 DIAGNOSIS — K219 Gastro-esophageal reflux disease without esophagitis: Secondary | ICD-10-CM

## 2012-09-05 DIAGNOSIS — G5700 Lesion of sciatic nerve, unspecified lower limb: Secondary | ICD-10-CM

## 2012-09-05 MED ORDER — OMEPRAZOLE 40 MG PO CPDR: 40.0000 mg | DELAYED_RELEASE_CAPSULE | Freq: Every day | ORAL | Status: DC

## 2012-09-05 MED ORDER — TRIAMTERENE-HCTZ 37.5-25 MG PO TABS: 1.0000 | ORAL_TABLET | Freq: Every day | ORAL | Status: DC

## 2012-09-05 MED ORDER — KETOROLAC TROMETHAMINE 60 MG/2ML IM SOLN
60.0000 mg | Freq: Once | INTRAMUSCULAR | Status: AC
  Administered 2012-09-05: 60 mg via INTRAMUSCULAR

## 2012-09-05 NOTE — Patient Instructions (Addendum)
It was nice to see you!  You should get a call from physical therapy in the next few days about your leg. You can continue to take meloxicam once a day as needed.   Take omeprazole once a day to help with the stomach.  Take Maxide once a day for your blood pressure.  Follow up in 3 months or sooner as needed.

## 2012-09-05 NOTE — Assessment & Plan Note (Signed)
Omeprazole refilled.

## 2012-09-05 NOTE — Progress Notes (Signed)
  Subjective:    Patient ID: Pamela Stewart, female    DOB: [birth year], 42 y.o.   MRN: [id number]  HPI Laurianne Portlock is here for f/u of stomach and leg.  Left leg pain She reports some improvement with the meloxicam and home exercises.  She states that she continues to have left buttock pain that radiates down to the her foot.  No numbness, tingling or weakness.  She had the same thing on the right several years ago and received a shot in clinic; she would like the shot again.  She is also interested in physical therapy.  Stomach Treated for h pylori last month.  She reports a "bubbling" sensation in her stomach after drinking sodas.  No diarrhea, nausea, or vomiting.    Hypertension She is concerned about her blood pressure as it was initially elevated today.  She reports compliance with her medication at home.  No chest pain, palpitations, or leg edema.  I have reviewed and updated the following as appropriate: allergies and current medications SHx: non smoker   Review of Systems See HPI    Objective:   Physical Exam BP 125/90  Pulse 112  Temp(Src) 98.8 F (37.1 C) (Oral)  Ht 5\' 5"  (1.651 m)  Wt 147 lb (66.679 kg)  BMI 24.46 kg/m2 Gen: alert, cooperative, NAD Back: no erythema or edema; tender over left piriformis, no vertebral tenderness; strength 5/5 in quads, hamstrings, dorsi and planar-flexion      Assessment & Plan:

## 2012-09-05 NOTE — Assessment & Plan Note (Signed)
Initially elevated, but repeat well controlled. Continue maxide, refills sent.

## 2012-09-05 NOTE — Assessment & Plan Note (Signed)
Some improvement with meloxicam and home exercises. Will give toradol 60mg  IM today. Referral for PT placed today.

## 2012-09-28 ENCOUNTER — Other Ambulatory Visit: Payer: Self-pay | Admitting: Emergency Medicine

## 2013-01-13 ENCOUNTER — Other Ambulatory Visit: Payer: Self-pay | Admitting: Family Medicine

## 2013-01-13 ENCOUNTER — Other Ambulatory Visit: Payer: Self-pay | Admitting: Emergency Medicine

## 2013-02-07 ENCOUNTER — Ambulatory Visit (INDEPENDENT_AMBULATORY_CARE_PROVIDER_SITE_OTHER): Payer: PRIVATE HEALTH INSURANCE | Admitting: *Deleted

## 2013-02-07 DIAGNOSIS — Z23 Encounter for immunization: Secondary | ICD-10-CM

## 2013-02-12 ENCOUNTER — Encounter: Payer: Self-pay | Admitting: Emergency Medicine

## 2013-02-12 ENCOUNTER — Ambulatory Visit (INDEPENDENT_AMBULATORY_CARE_PROVIDER_SITE_OTHER): Payer: PRIVATE HEALTH INSURANCE | Admitting: Emergency Medicine

## 2013-02-12 VITALS — BP 134/87 | HR 89 | Temp 98.3°F | Ht 65.0 in | Wt 151.0 lb

## 2013-02-12 DIAGNOSIS — G43909 Migraine, unspecified, not intractable, without status migrainosus: Secondary | ICD-10-CM

## 2013-02-12 DIAGNOSIS — G479 Sleep disorder, unspecified: Secondary | ICD-10-CM

## 2013-02-12 MED ORDER — IBUPROFEN 600 MG PO TABS: 600.0000 mg | ORAL_TABLET | Freq: Every day | ORAL | Status: DC | PRN

## 2013-02-12 MED ORDER — PROMETHAZINE HCL 25 MG PO TABS: 25.0000 mg | ORAL_TABLET | Freq: Every day | ORAL | Status: DC | PRN

## 2013-02-12 MED ORDER — DIPHENHYDRAMINE HCL 25 MG PO TABS: 25.0000 mg | ORAL_TABLET | Freq: Every day | ORAL | Status: DC | PRN

## 2013-02-12 NOTE — Patient Instructions (Signed)
It was nice to see you!  Headache - I think you have migraines. - I sent some medicines to your pharmacy. - Take 1 phenergan, 1 benadryl and 1 ibuprofen tonight.  This will make you sleepy! - your headache should be much better when you wake up. - The next time a headache starts, take 1 phenergan, 1 benadryl and 1 ibuprofen as soon as it starts.  Sleep - I think this will improve once the headache goes away. - you can take 1 benadryl 20 minutes before bed for the next week. - Please keep a sleep diary.  Record what time you go to bed, what time you get up in the morning, and how many times you get up during the night. - No TV for 2 hours before bedtime. - No naps during the day.  Follow up in 1-2 weeks.

## 2013-02-12 NOTE — Progress Notes (Signed)
   Subjective:    Patient ID: Pamela Stewart, female    DOB: 04/27/1970, 43 y.o.   MRN: [id number]  HPI Pamela Stewart is here for several concerns including headache, sleep issues, heartburn, foot issue, and menstrual issue.  We decided to address headaches and sleep today.  She will return for another appointment to discuss her other concerns.  1. Headache: Reports a right sided headache for the last 3 weeks.  It is present everyday, but waxes and wanes.  Some improvement with tylenol and ibuprofen.  Associated with intermittent vertigo (described a 2-3 minutes of room spinning), nausea, photophobia and phonophobia.  No numbness, tingling or weakness.   She has had similar headaches in the past, but this one has lasted longer than before.  2. Sleep issues: She reports having difficulty staying asleep.  Sometimes will have trouble falling asleep as well.  Goes to bed between 10pm and 12am most night.  Wakes up at a consistent time in the morning for work.  She states she usually will wake up at 2 or 3am and not be able to go back to sleep.  Attributes some to worry and some to headache.  Watches about 30 minutes of TV in the hour or so before bed.  Current Outpatient Prescriptions on File Prior to Visit  Medication Sig Dispense Refill  . cyclobenzaprine (FLEXERIL) 5 MG tablet Take 1 tablet (5 mg total) by mouth 3 (three) times daily as needed for muscle spasms.  30 tablet  1  . famotidine (PEPCID) 20 MG tablet Take 1 tablet (20 mg total) by mouth 2 (two) times daily.  10 tablet  0  . FLUoxetine (PROZAC) 10 MG capsule Take 1 capsule (10 mg total) by mouth daily.  30 capsule  2  . meloxicam (MOBIC) 15 MG tablet TAKE 1 TABLET BY MOUTH EVERY DAY  30 tablet  0  . omeprazole (PRILOSEC) 40 MG capsule Take 1 capsule (40 mg total) by mouth daily.  30 capsule  3  . triamterene-hydrochlorothiazide (MAXZIDE-25) 37.5-25 MG per tablet Take 1 tablet by mouth daily.  90 tablet  3   No current facility-administered  medications on file prior to visit.    I have reviewed and updated the following as appropriate: allergies and current medications SHx: never smoker   Review of Systems See HPI    Objective:   Physical Exam BP 134/87  Pulse 89  Temp(Src) 98.3 F (36.8 C) (Oral)  Ht 5\' 5"  (1.651 m)  Wt 151 lb (68.493 kg)  BMI 25.13 kg/m2  LMP 02/04/2013 Gen: alert, cooperative, NAD HEENT: AT/[place], sclera white, MMM Neuro: alert, oriented, CN II-XII intact bilaterally; sensation grossly intact to light touch through out; 5/5 strength in all extremities; 2+ biceps and patellar reflexes bilaterally     Assessment & Plan:

## 2013-02-12 NOTE — Assessment & Plan Note (Signed)
Will do acute treatment for now. She is to take phenergan, benadryl, and ibuprofen at the start of her headaches. If she is needing to do this more than once a week, will start prophylactic therapy. F/u in 1-2 weeks.

## 2013-02-12 NOTE — Assessment & Plan Note (Signed)
Suspect related to headache rather than true insomnia. Discussed sleep hygiene and sleep diary. Okay to use benadryl qHS for next week. F/u in 1-2 weeks.

## 2013-02-14 ENCOUNTER — Encounter: Payer: Self-pay | Admitting: *Deleted

## 2013-02-28 ENCOUNTER — Ambulatory Visit (INDEPENDENT_AMBULATORY_CARE_PROVIDER_SITE_OTHER): Payer: PRIVATE HEALTH INSURANCE | Admitting: Emergency Medicine

## 2013-02-28 ENCOUNTER — Encounter: Payer: Self-pay | Admitting: Emergency Medicine

## 2013-02-28 VITALS — BP 124/88 | HR 86 | Temp 98.6°F | Ht 61.0 in | Wt 150.0 lb

## 2013-02-28 DIAGNOSIS — M549 Dorsalgia, unspecified: Secondary | ICD-10-CM

## 2013-02-28 DIAGNOSIS — M5430 Sciatica, unspecified side: Secondary | ICD-10-CM

## 2013-02-28 DIAGNOSIS — K219 Gastro-esophageal reflux disease without esophagitis: Secondary | ICD-10-CM

## 2013-02-28 DIAGNOSIS — IMO0001 Reserved for inherently not codable concepts without codable children: Secondary | ICD-10-CM

## 2013-02-28 DIAGNOSIS — G43909 Migraine, unspecified, not intractable, without status migrainosus: Secondary | ICD-10-CM

## 2013-02-28 MED ORDER — OMEPRAZOLE 40 MG PO CPDR: 40.0000 mg | DELAYED_RELEASE_CAPSULE | Freq: Every day | ORAL | Status: DC

## 2013-02-28 MED ORDER — MELOXICAM 15 MG PO TABS: ORAL_TABLET | ORAL | Status: DC

## 2013-02-28 MED ORDER — CYCLOBENZAPRINE HCL 5 MG PO TABS: 5.0000 mg | ORAL_TABLET | Freq: Three times a day (TID) | ORAL | Status: DC | PRN

## 2013-02-28 MED ORDER — NORTRIPTYLINE HCL 25 MG PO CAPS: 25.0000 mg | ORAL_CAPSULE | Freq: Every day | ORAL | Status: DC

## 2013-02-28 NOTE — Assessment & Plan Note (Signed)
Likely flare of her piriformis syndrome. Exercises given; refilled meloxicam and flexeril.

## 2013-02-28 NOTE — Patient Instructions (Signed)
It was nice to see you!  STOP the benadryl, phenergan and ibuprofen. START nortriptyline 1 capsule at bedtime to help with mood, sleep and headache.  Take Prilosec daily for heartburn.  Take meloxicam daily for 1 week, then as needed for back pain. Use flexeril as needed for muscle spasm.  Follow up in 1 month.

## 2013-02-28 NOTE — Assessment & Plan Note (Signed)
Not controlled with abortive therapy alone. Will start nortriptyline for prophylaxis given sleep issues and some mood issues. F/u in 2-4 weeks.

## 2013-02-28 NOTE — Progress Notes (Signed)
   Subjective:    Patient ID: Pamela Stewart, female    DOB: 11/09/1970, 43 y.o.   MRN: [id number]  HPI Pamela Stewart is here for f/u headaches and GERD.  There is a language barrier, pt declined interpreter.  Headaches She states that she took the phenergan + benadryl + ibuprofen every day last week and has continued to have almost daily headaches.  She has not taken this in 3 days.  She does state her sleep has been a little better.  Reflux Reports heartburn.  Unclear is she is taking the prilosec.  Left buttock pain Reports recurrence of her left buttock pain with radiation down her leg.  She would like the medications and exercises given previously.  Current Outpatient Prescriptions on File Prior to Visit  Medication Sig Dispense Refill  . triamterene-hydrochlorothiazide (MAXZIDE-25) 37.5-25 MG per tablet Take 1 tablet by mouth daily.  90 tablet  3   No current facility-administered medications on file prior to visit.    I have reviewed and updated the following as appropriate: allergies and current medications SHx: non smoker   Review of Systems See HPI    Objective:   Physical Exam BP 124/88  Pulse 86  Temp(Src) 98.6 F (37 C) (Oral)  Ht 5\' 1"  (1.549 m)  Wt 150 lb (68.04 kg)  BMI 28.36 kg/m2  LMP 02/04/2013 Gen: alert, cooperative, NAD HEENT: AT/[place], sclera white, MMM Neck: supple CV: RRR, no murmurs Pulm: CTAB, no wheezes or rales Abd: +BS, soft, NTND Ext: no edema     Assessment & Plan:

## 2013-02-28 NOTE — Assessment & Plan Note (Signed)
Restart omeprazole 20mg daily.

## 2013-09-02 ENCOUNTER — Ambulatory Visit (INDEPENDENT_AMBULATORY_CARE_PROVIDER_SITE_OTHER): Payer: Self-pay | Admitting: Family Medicine

## 2013-09-02 VITALS — BP 145/89 | HR 70 | Temp 98.0°F | Wt 147.0 lb

## 2013-09-02 DIAGNOSIS — R51 Headache: Secondary | ICD-10-CM

## 2013-09-02 DIAGNOSIS — M62838 Other muscle spasm: Secondary | ICD-10-CM

## 2013-09-02 MED ORDER — CYCLOBENZAPRINE HCL 5 MG PO TABS: 5.0000 mg | ORAL_TABLET | Freq: Three times a day (TID) | ORAL | Status: DC | PRN

## 2013-09-02 NOTE — Patient Instructions (Addendum)
Thank you for coming in,   I think your headache are from tension caused by the muscle in your neck. I am prescribing you a muscle relaxer called flexeril for it.   Please stop taking tylenol and Advil. Try this for a short time. If your headaches are still present then call in and we can try a different medication.   Please follow up with Dr. Pollie MeyerMcIntyre in two weeks.    Please feel free to call with any questions or concerns at any time, at [phone]. --Dr. Jordan LikesSchmitz

## 2013-09-03 ENCOUNTER — Encounter: Payer: Self-pay | Admitting: Family Medicine

## 2013-09-03 DIAGNOSIS — R519 Headache, unspecified: Secondary | ICD-10-CM | POA: Insufficient documentation

## 2013-09-03 DIAGNOSIS — R51 Headache: Secondary | ICD-10-CM | POA: Insufficient documentation

## 2013-09-03 NOTE — Progress Notes (Signed)
   Subjective:    Patient ID: Pamela FlorasHThien Stewart, female    DOB: 08/13/1970, 43 y.o.   MRN: [id number]  HPI  Pamela Stewart is here in same day clinic for headache. Patient's natural language is mont[place] but is not accompanied with a Nurse, learning disabilitytranslator. She reports being able to speak [place].   She has a history of migraines and was prescribed nortriptyline in the past. She hasn't been to clinic for a couple of years due to lack of resources. She has had worsening headaches occuring on bilateral temples and the top of her scalp. She has decreased sleep. It is squeezing in nature. She has been taking tylenol and advil for the past couple of months that helps with the pain. She denies any photophobia or aura. They last more than one hour. She can wake up with a headache. She denies any changes in her vision. She wears glasses but last evaluated a year ago. She has a history of hypertension but not currently taking any medication. Worrying worsens her headache.   Current Outpatient Prescriptions on File Prior to Visit  Medication Sig Dispense Refill  . triamterene-hydrochlorothiazide (MAXZIDE-25) 37.5-25 MG per tablet Take 1 tablet by mouth daily.  90 tablet  3   No current facility-administered medications on file prior to visit.     Review of Systems See HPI     Objective:   Physical Exam BP 145/89  Pulse 70  Temp(Src) 98 F (36.7 C) (Oral)  Wt 147 lb (66.679 kg) Gen: NAD, alert, cooperative with exam,  HEENT: NCAT, PERRL, EOMI, clear conjunctiva, oropharynx clear, supple neck MSK: 5/5 strength in UE/LE b/l, normal sensation throughout.  Neuro: CN 2-12 intact, no gross deficits.  Psych: flat affect        Assessment & Plan:

## 2013-09-03 NOTE — Assessment & Plan Note (Signed)
Most likely resembles tension headache with tight neck and trapezius muscles. Does have a history of migraine but no follow up in a few years. Could be rebound HA with daily use of tylenol and aleve. Could be associated with worsening eye sight with no elevation recently. No signs of intracranial pathology as CN's intact and no diplopia. Has history of hypertension but not currently on any medication.  - flexeril 5 mg  - stop tylenol or aleve  - refilled anti-hypertensive's.  - try relaxation techniques.  - follow up with PCP for this problem and re-establishing care

## 2013-10-02 ENCOUNTER — Ambulatory Visit: Payer: Self-pay | Admitting: Family Medicine

## 2014-10-07 ENCOUNTER — Encounter: Payer: PRIVATE HEALTH INSURANCE | Admitting: Internal Medicine

## 2014-11-11 ENCOUNTER — Other Ambulatory Visit (HOSPITAL_COMMUNITY)
Admission: RE | Admit: 2014-11-11 | Discharge: 2014-11-11 | Disposition: A | Discharge status: Home / Self Care | Payer: PRIVATE HEALTH INSURANCE | Source: Ambulatory Visit | Attending: Family Medicine | Admitting: Family Medicine

## 2014-11-11 ENCOUNTER — Encounter: Payer: Self-pay | Admitting: Internal Medicine

## 2014-11-11 ENCOUNTER — Ambulatory Visit (INDEPENDENT_AMBULATORY_CARE_PROVIDER_SITE_OTHER): Payer: PRIVATE HEALTH INSURANCE | Admitting: Internal Medicine

## 2014-11-11 VITALS — BP 186/110 | HR 80 | Temp 97.8°F | Ht 61.0 in | Wt 151.0 lb

## 2014-11-11 DIAGNOSIS — Z113 Encounter for screening for infections with a predominantly sexual mode of transmission: Secondary | ICD-10-CM | POA: Diagnosis present

## 2014-11-11 DIAGNOSIS — Z23 Encounter for immunization: Secondary | ICD-10-CM

## 2014-11-11 DIAGNOSIS — N949 Unspecified condition associated with female genital organs and menstrual cycle: Secondary | ICD-10-CM | POA: Insufficient documentation

## 2014-11-11 DIAGNOSIS — M5442 Lumbago with sciatica, left side: Secondary | ICD-10-CM

## 2014-11-11 DIAGNOSIS — M549 Dorsalgia, unspecified: Secondary | ICD-10-CM | POA: Insufficient documentation

## 2014-11-11 DIAGNOSIS — Z124 Encounter for screening for malignant neoplasm of cervix: Secondary | ICD-10-CM | POA: Diagnosis not present

## 2014-11-11 DIAGNOSIS — N898 Other specified noninflammatory disorders of vagina: Secondary | ICD-10-CM

## 2014-11-11 DIAGNOSIS — Z0000 Encounter for general adult medical examination without abnormal findings: Secondary | ICD-10-CM | POA: Insufficient documentation

## 2014-11-11 DIAGNOSIS — Z01411 Encounter for gynecological examination (general) (routine) with abnormal findings: Secondary | ICD-10-CM | POA: Insufficient documentation

## 2014-11-11 DIAGNOSIS — M62838 Other muscle spasm: Secondary | ICD-10-CM

## 2014-11-11 DIAGNOSIS — Z1151 Encounter for screening for human papillomavirus (HPV): Secondary | ICD-10-CM | POA: Insufficient documentation

## 2014-11-11 DIAGNOSIS — I10 Essential (primary) hypertension: Secondary | ICD-10-CM | POA: Diagnosis not present

## 2014-11-11 DIAGNOSIS — E663 Overweight: Secondary | ICD-10-CM | POA: Insufficient documentation

## 2014-11-11 LAB — COMPREHENSIVE METABOLIC PANEL
ALK PHOS: 59 U/L (ref 33–115)
ALT: 35 U/L — AB (ref 6–29)
AST: 17 U/L (ref 10–35)
Albumin: 4.2 g/dL (ref 3.6–5.1)
BILIRUBIN TOTAL: 0.3 mg/dL (ref 0.2–1.2)
BUN: 17 mg/dL (ref 7–25)
CO2: 28 mmol/L (ref 20–31)
CREATININE: 0.89 mg/dL (ref 0.50–1.10)
Calcium: 9 mg/dL (ref 8.6–10.2)
Chloride: 102 mmol/L (ref 98–110)
GLUCOSE: 84 mg/dL (ref 65–99)
POTASSIUM: 4.1 mmol/L (ref 3.5–5.3)
SODIUM: 141 mmol/L (ref 135–146)
TOTAL PROTEIN: 7.2 g/dL (ref 6.1–8.1)

## 2014-11-11 LAB — LIPID PANEL
Cholesterol: 215 mg/dL — ABNORMAL HIGH (ref 125–200)
HDL: 32 mg/dL — AB (ref >=46)
LDL CALC: 109 mg/dL (ref <130)
Total CHOL/HDL Ratio: 6.7 Ratio — ABNORMAL HIGH (ref <=5.0)
Triglycerides: 371 mg/dL — ABNORMAL HIGH (ref <150)
VLDL: 74 mg/dL — ABNORMAL HIGH (ref <30)

## 2014-11-11 LAB — POCT WET PREP (WET MOUNT): CLUE CELLS WET PREP WHIFF POC: NEGATIVE

## 2014-11-11 MED ORDER — HYDROCHLOROTHIAZIDE 25 MG PO TABS: 25.0000 mg | ORAL_TABLET | Freq: Every day | ORAL | Status: DC

## 2014-11-11 MED ORDER — CYCLOBENZAPRINE HCL 5 MG PO TABS: 5.0000 mg | ORAL_TABLET | Freq: Three times a day (TID) | ORAL | Status: DC | PRN

## 2014-11-11 NOTE — Assessment & Plan Note (Signed)
-  Flu shot administered. -Screening labs obtained: HIV, pap smear with reflex HPV testing.

## 2014-11-11 NOTE — Assessment & Plan Note (Signed)
-  Ordered lipid panel. Address results at follow-up.

## 2014-11-11 NOTE — Assessment & Plan Note (Addendum)
-  Blood pressure consistently elevated today. -Restarted HCTZ 25 mg daily. Recheck in 2 weeks. -Could be contributing to headaches. -Obtained CMP to assess creatinine and basic electrolytes.

## 2014-11-11 NOTE — Assessment & Plan Note (Addendum)
-  Obtained wet prep and gc/chlamydia probe. -Will contact patient with results and prescribe treatment if necessary. -No odor, pain, or unusual discharge noted on exam.

## 2014-11-11 NOTE — Progress Notes (Signed)
Subjective: Pamela Stewart is a 44 y.o. female patient of Pamela HaringHillary M Talecia Sherlin, MD, presenting for HTN, multiple chronic issues, and to re-establish care.  Visit enabled by [employer] for [place] (interpreter: [id number]).  HTN: -Patient stopped taking triamterene-hydrochlorothiazide 37.5-25 mg 1-2 years ago when her insurance expired. -She has had chronic headaches for "years," which keep her from sleeping well.  -She denies vision changes but states she sometimes sees flashes when she turns on lights or gets in the car  Vaginal discomfort: -Describes "pain in uterus and itchiness" -Interested in STD screening, including HIV. -Currently on day 2 of her menstrual cycle.   Headaches: -Takes 2 Advil twice a day, which relieves symptoms slightly.  -Nothing seems to bring them on.  -Describes headaches as an "achey-ness all around her head" that can last 2-5 minutes or hours. -Going to sleep improves symptoms. -Previously has been prescribed nortriptyline; phenergan, benadryl and ibuprofen. Neither treatment improved headache just made her sleepy.  Back pain: -Pain in lower back for the last 2-3 years. -Mostly in left lower back but sometimes radiates down legs, once with associated calf cramping. -Denies bladder or bowel incontinence.  -Worsened with laying down then getting up. -Works at a [employer], sitting all day. -Flexeril has helped in the past, but prescription ran out.  -Previous x-rays of spine have been negative for compression fractures or subluxation. Mild lower lumbar facet arthropathy present (08/02/12). Transitional anatomy with "sacralized L5" noted.   Health maintenance: -Interested in flu shot, pap smear, STD testing.  - ROS: Endorses both watery and dry eyes, occasional cough, foot odor, and "weird feeling" when people talk very loud. - Nonsmoker  Objective: BP 186/110 mmHg  Pulse 80  Temp(Src) 97.8 F (36.6 C) (Oral)  Ht 5\' 1"  (1.549 m)  Wt 151 lb  (68.493 kg)  BMI 28.55 kg/m2  Initial blood pressure was 184/106. Gen: Well-appearing 44 y.o. female in no distress HEENT: pterygium on left pupil medially, no cervical adenopathy Cardiac: RRR, S1, S2, no murmurs, rubs or gallops Pulm: CTAB Abdomen: Soft, non-tender, non-distended GU: BRB from os. Scant clear discharge noted. No lesions seen on speculum exam. No cervical motion tenderness on bimanual exam. MSK: Mild TTP of lower back and mild discomfort with lateral movement. Neuro: CNII-XII grossly intact.   Assessment/Plan: Pamela Stewart is a 44 y.o. female here for HTN, chronic pain and to re-establish care. Focused on HTN and health maintenance at today's visit.  Patient to return in 2 weeks for blood pressure re-check and address headaches more fully.  HTN (hypertension) -Blood pressure consistently elevated today. -Restarted HCTZ 25 mg daily. Recheck in 2 weeks. -Could be contributing to headaches. -Obtained CMP to assess creatinine and basic electrolytes.  Vaginal discomfort -Obtained wet prep and gc/chlamydia probe. -Will contact patient with results and prescribe treatment if necessary. -No odor, pain, or unusual discharge noted on exam.   Back pain -Chronic issue. Job that involves sitting all day may be contributing to discomfort. -Refilled flexeril rx. -Would benefit from counseling on stretching exercises. Did not offer today.  Overweight (BMI 25.0-29.9) -Ordered lipid panel. Address results at follow-up.  Healthcare maintenance -Flu shot administered. -Screening labs obtained: HIV, pap smear with reflex HPV testing.    Pamela GobbleHillary Merrilyn Legler, MD [facility], PGY-1

## 2014-11-11 NOTE — Assessment & Plan Note (Signed)
-  Chronic issue. Job that involves sitting all day may be contributing to discomfort. -Refilled flexeril rx. -Would benefit from counseling on stretching exercises. Did not offer today.

## 2014-11-11 NOTE — Patient Instructions (Signed)
Pamela Stewart, it was nice to meet you today.  For blood pressure, please take 25 mg hydrochlorothiazide daily. I would like you to return to clinic in 2 weeks to recheck your blood pressure, review labs results from today, and ask more about your headaches.  Please make an appointment in 2 weeks.   T?ng huy?t p (Hypertension) T?ng huy?t p, th??ng ???c g?i l huy?t p cao, l khi l?c b?m mu qua ??ng m?ch c?a qu v? qu m?nh. ??ng m?ch c?a qu v? l cc m?ch mu mang mu t? tim ?i kh?p c? th? c?a qu v?. K?t qu? ?o huy?t p c m?t con s? cao v m?t con s? th?p, ch?ng h?n nh? 110/72. Con s? cao (tm thu) l p l?c bn trong ??ng m?ch khi tim qu v? b?m. Con s? th?p (tm tr??ng) l p l?c bn trong ??ng m?ch khi tim qu v? gin ra. Huy?t p l t??ng c?n cho qu v? ph?i d??i 120/80. Ch?ng t?ng huy?t p bu?c tim qu v? ph?i lm vi?c v?t v? h?n ?? b?m mu. ??ng m?ch c?a qu v? c th? b? h?p ho?c c?ng. Huy?t p cao khng ???c ?i?u tr? ho?c khng ???c ki?m sot c th? d?n t?i nh?i mu c? tim, ??t qu?, b?nh th?n v nh?ng v?n ?? khc. CC Y?U T? NGUY C? M?t s? y?u t? nguy c? d?n ??n huy?t p cao c th? ki?m sot ???c. M?t s? y?u t? khc th khng.  Nh?ng y?u t? nguy c? khng th? ki?m sot ???c bao g?m:   Ch?ng t?c. Qu v? c nguy c? cao h?n n?u qu v? l ng??i M? g?c Phi.  ?? tu?i. Nguy c? t?ng ln theo ?? tu?i.  Gi?i tnh. Nam gi?i c nguy c? cao h?n ph? n? tr??c tu?i 44. Sau tu?i 64, ph? n? c nguy c? cao h?n nam gi?i. Nh?ng y?u t? nguy c? c th? ki?m sot ???c bao g?m:  Khng t?p th? d?c ho?c cc ho?t ??ng th? ch?t ??y ??Marland Kitchen.  Th?a cn.  ?n qu nhi?u ch?t bo, ???ng, ca-lo, ho?c mu?i.  U?ng qu nhi?u r??u. D?U HI?U V TRI?U CH?NG T?ng huy?t p th??ng khng gy ra d?u hi?u ho?c tri?u ch?ng. Huy?t p r?t cao (c?n cao huy?t p) c th? gy ?au ??u, lo l?ng, kh th? v ch?y mu cam. CH?N ?ON ?? ki?m tra xem qu v? c t?ng huy?t p khng, chuyn gia ch?m [place] s?c kh?e c?a qu v? s? ?o huy?t p trong khi  qu v? ng?i ??t tay ? m?c ngang v?i tim. Huy?t p c?n ???c ?o t nh?t hai l?n trn cng m?t cnh tay. M?t s? tnh tr?ng nh?t ??nh c th? lm cho huy?t p khc nhau gi?a tay ph?i v tay tri c?a qu v?. K?t qu? ?o huy?t p cao h?n bnh th??ng ? m?t th?i ?i?m no ? khng c ngh?a l qu v? c?n ?i?u tr?Marland Kitchen. N?u khng r li?u qu v? c huy?t p cao hay khng, qu v? c th? ???c ?? ngh? tr? l?i vo m?t ngy khc ?? ki?m tra l?i huy?t p. Ho?c qu v? c th? ???c yu c?u theo di huy?t p ? nh trong 1 tu?n ho?c h?n. ?I?U TR? ?i?u tr? huy?t p cao gao g?m thay ??i l?i s?ng v c th? ph?i dng thu?c. C m?t l?i s?ng lnh m?nh c th? gip lm gi?m huy?t p cao. Qu v? c th? c?n thay ??i m?t s? thi quen. Thay ??i l?i  s?ng c th? bao g?m:  Th?c hi?n ch? ?? ?n DASH. Ch? ?? ?n ny c nhi?u tri cy, rau v ng? c?c nguyn h?t. C t mu?i, th?t ??, v t b? sung ???ng.  Duy tr l??ng mu?i tiu th? d??i 2.300 mg m?i ngy.  T?p aerobic t nh?t 30-45 pht t nh?t 4 l?n m?i tu?n.  Gi?m cn n?u c?n thi?t.  Khng ht thu?c.  H?n ch? ?? u?ng c c?n.  H?c cc cch gi?m c?ng th?ng. Chuyn gia ch?m [place] s?c kh?e c th? k ??n thu?c n?u thay ??i l?i s?ng khng ?? ?? ??a huy?t p v? m?c c th? ki?m sot ???c v n?u m?t trong nh?ng ?i?u sau l ?ng:  Qu v? t? 18-59 tu?i v huy?t p tm thu c?a qu v? trn 140.  Qu v? t? 60 tu?i tr? ln v huy?t p tm thu c?a qu v? trn 150.  Huy?t p tm tr??ng c?a qu v? trn 90.  Qu v? b? ti?u ???ng v huy?t p tm thu c?a qu v? trn 140 ho?c huy?t p tm tr??ng c?a qu v? trn 90.  Qu v? b? b?nh th?n v huy?t p qu v? trn 140/90.  Qu v? b? b?nh tim v huy?t p qu v? trn 140/90. Huy?t p m?c tiu c nhn c?a qu v? c th? khc nhau ty thu?c v tnh tr?ng b?nh l, tu?i v cc nhn t? khc. H??NG D?N CH?M [place] T?I [place]  Ki?m tra l?i huy?t p c?a qu v? theo ch? d?n c?a chuyn gia ch?m [place] s?c kh?e.  Ch? s? d?ng thu?c theo ch? d?n c?a chuyn gia ch?m [place] s?c kh?e.  Lm theo ch? d?n m?t cch c?n th?n. Thu?c ?i?u tr? huy?t p ph?i ???c dng theo ??n ? k. Thu?c c?ng s? khng c tc d?ng khi qu v? b? li?u. Vi?c b? li?u thu?c c?ng lm qu v? c nguy c? pht sinh v?n ??Imagene Sheller ht thu?c.  Theo di huy?t p c?a qu v? ? nh theo ch? d?n c?a chuyn gia ch?m [place] s?c kh?e. ?I KHM N?U:   Qu v? ngh? qu v? c ph?n ?ng v?i thu?c ?ang dng.  Qu v? b? ?au ??u ho?c c?m th?y chng m?t ti di?n.  Qu v? b? s?ng ph ? m?t c chn.  Qu v? c v?n ?? v? th? l?c. NGAY L?P T?C ?I K[facility] N?U:  Qu v? b? ?au ??u n?ng ho?c l l?n.  Qu v? b? y?u b?t th??ng, t b, ho?c c?m th?y nh? ng?t x?u.  Qu v? b? ?au ng?c ho?c ?au b?ng r?t nhi?u.  Qu v? nn nhi?u l?n.  Qu v? b? kh th?. ??M B?O QU V?:   Hi?u r cc h??ng d?n ny.  S? theo di tnh tr?ng c?a mnh.  S? yu c?u tr? gip ngay l?p t?c n?u qu v? c?m th?y khng kh?e ho?c th?y tr?m tr?ng h?n.   Thng tin ny khng nh?m m?c ?ch thay th? cho l?i khuyn m chuyn gia ch?m [place] s?c kh?e ni v?i qu v?. Hy b?o ??m qu v? ph?i th?o lu?n b?t k? v?n ?? g m qu v? c v?i chuyn gia ch?m [place] s?c kh?e c?a qu v?.   Document Released: 01/02/2005 Document Revised: 09/23/2014 Elsevier Interactive Patient Education [employer]

## 2014-11-12 LAB — HIV ANTIBODY (ROUTINE TESTING W REFLEX): HIV: NONREACTIVE

## 2014-11-13 LAB — CYTOLOGY - PAP

## 2014-12-02 ENCOUNTER — Encounter: Payer: Self-pay | Admitting: Internal Medicine

## 2016-02-17 ENCOUNTER — Emergency Department (HOSPITAL_COMMUNITY): Payer: BLUE CROSS/BLUE SHIELD

## 2016-02-17 ENCOUNTER — Emergency Department (HOSPITAL_COMMUNITY)
Admission: EM | Admit: 2016-02-17 | Discharge: 2016-02-17 | Disposition: A | Discharge status: Home / Self Care | Payer: BLUE CROSS/BLUE SHIELD | Attending: Emergency Medicine | Admitting: Emergency Medicine

## 2016-02-17 ENCOUNTER — Encounter (HOSPITAL_COMMUNITY): Payer: Self-pay | Admitting: *Deleted

## 2016-02-17 DIAGNOSIS — Y9389 Activity, other specified: Secondary | ICD-10-CM | POA: Insufficient documentation

## 2016-02-17 DIAGNOSIS — Y92410 Unspecified street and highway as the place of occurrence of the external cause: Secondary | ICD-10-CM | POA: Diagnosis not present

## 2016-02-17 DIAGNOSIS — I10 Essential (primary) hypertension: Secondary | ICD-10-CM | POA: Insufficient documentation

## 2016-02-17 DIAGNOSIS — R930 Abnormal findings on diagnostic imaging of skull and head, not elsewhere classified: Secondary | ICD-10-CM | POA: Insufficient documentation

## 2016-02-17 DIAGNOSIS — Y999 Unspecified external cause status: Secondary | ICD-10-CM | POA: Insufficient documentation

## 2016-02-17 DIAGNOSIS — M545 Low back pain, unspecified: Secondary | ICD-10-CM

## 2016-02-17 DIAGNOSIS — S3992XA Unspecified injury of lower back, initial encounter: Secondary | ICD-10-CM | POA: Insufficient documentation

## 2016-02-17 DIAGNOSIS — S8992XA Unspecified injury of left lower leg, initial encounter: Secondary | ICD-10-CM | POA: Diagnosis not present

## 2016-02-17 DIAGNOSIS — R079 Chest pain, unspecified: Secondary | ICD-10-CM

## 2016-02-17 DIAGNOSIS — M25562 Pain in left knee: Secondary | ICD-10-CM

## 2016-02-17 LAB — I-STAT CHEM 8, ED
BUN: 16 mg/dL (ref 6–20)
CALCIUM ION: 1.05 mmol/L — AB (ref 1.15–1.40)
CHLORIDE: 103 mmol/L (ref 101–111)
Creatinine, Ser: 1 mg/dL (ref 0.44–1.00)
Glucose, Bld: 117 mg/dL — ABNORMAL HIGH (ref 65–99)
HCT: 37 % (ref 36.0–46.0)
HEMOGLOBIN: 12.6 g/dL (ref 12.0–15.0)
Potassium: 3.7 mmol/L (ref 3.5–5.1)
SODIUM: 141 mmol/L (ref 135–145)
TCO2: 23 mmol/L (ref 0–100)

## 2016-02-17 LAB — I-STAT BETA HCG BLOOD, ED (MC, WL, AP ONLY)

## 2016-02-17 MED ORDER — IOPAMIDOL (ISOVUE-300) INJECTION 61%
INTRAVENOUS | Status: AC
  Administered 2016-02-17: 75 mL
  Filled 2016-02-17: qty 75

## 2016-02-17 MED ORDER — TRAMADOL HCL 50 MG PO TABS
50.0000 mg | ORAL_TABLET | Freq: Once | ORAL | Status: AC
  Administered 2016-02-17: 50 mg via ORAL
  Filled 2016-02-17: qty 1

## 2016-02-17 MED ORDER — ONDANSETRON HCL 4 MG PO TABS: 4.0000 mg | ORAL_TABLET | Freq: Four times a day (QID) | ORAL | 0 refills | Status: DC

## 2016-02-17 MED ORDER — IBUPROFEN 800 MG PO TABS: 800.0000 mg | ORAL_TABLET | Freq: Three times a day (TID) | ORAL | 0 refills | Status: DC

## 2016-02-17 MED ORDER — ONDANSETRON 4 MG PO TBDP
4.0000 mg | ORAL_TABLET | Freq: Once | ORAL | Status: AC
  Administered 2016-02-17: 4 mg via ORAL
  Filled 2016-02-17: qty 1

## 2016-02-17 MED ORDER — TRAMADOL HCL 50 MG PO TABS: 50.0000 mg | ORAL_TABLET | Freq: Four times a day (QID) | ORAL | 0 refills | Status: DC | PRN

## 2016-02-17 MED ORDER — MORPHINE SULFATE (PF) 4 MG/ML IV SOLN
4.0000 mg | Freq: Once | INTRAVENOUS | Status: AC
  Administered 2016-02-17: 4 mg via INTRAVENOUS
  Filled 2016-02-17: qty 1

## 2016-02-17 NOTE — ED Notes (Signed)
Pt transported to radiology.

## 2016-02-17 NOTE — ED Notes (Signed)
Pt given ginger ale and asked to take small sips. Reports pain meds made her nauseated.

## 2016-02-17 NOTE — Discharge Instructions (Signed)

## 2016-02-17 NOTE — ED Notes (Signed)
ED Provider at bedside. 
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
## 2016-02-17 NOTE — ED Notes (Signed)
Pt returns from radiology. 

## 2016-02-21 ENCOUNTER — Encounter: Payer: Self-pay | Admitting: Internal Medicine

## 2016-02-21 ENCOUNTER — Ambulatory Visit (INDEPENDENT_AMBULATORY_CARE_PROVIDER_SITE_OTHER): Payer: BLUE CROSS/BLUE SHIELD | Admitting: Internal Medicine

## 2016-02-21 VITALS — BP 158/110 | HR 87 | Temp 98.1°F | Ht 61.0 in | Wt 155.4 lb

## 2016-02-21 DIAGNOSIS — Z23 Encounter for immunization: Secondary | ICD-10-CM

## 2016-02-21 DIAGNOSIS — M7918 Myalgia, other site: Secondary | ICD-10-CM

## 2016-02-21 DIAGNOSIS — R079 Chest pain, unspecified: Secondary | ICD-10-CM | POA: Diagnosis not present

## 2016-02-21 DIAGNOSIS — M791 Myalgia: Secondary | ICD-10-CM | POA: Diagnosis not present

## 2016-02-21 DIAGNOSIS — I10 Essential (primary) hypertension: Secondary | ICD-10-CM | POA: Diagnosis not present

## 2016-02-21 MED ORDER — TRAMADOL HCL 50 MG PO TABS: 50.0000 mg | ORAL_TABLET | Freq: Four times a day (QID) | ORAL | 0 refills | Status: DC | PRN

## 2016-02-21 MED ORDER — HYDROCHLOROTHIAZIDE 25 MG PO TABS: 25.0000 mg | ORAL_TABLET | Freq: Every day | ORAL | 2 refills | Status: DC
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
## 2016-02-23 DIAGNOSIS — M7918 Myalgia, other site: Secondary | ICD-10-CM | POA: Insufficient documentation

## 2016-02-23 NOTE — Assessment & Plan Note (Signed)
-   BP elevated without medication. - Reordered HCTZ 25 mg daily. - Made f/u appointment for next month to gauge improvement
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
## 2016-03-20 ENCOUNTER — Ambulatory Visit (INDEPENDENT_AMBULATORY_CARE_PROVIDER_SITE_OTHER): Payer: BLUE CROSS/BLUE SHIELD | Admitting: Internal Medicine

## 2016-03-20 ENCOUNTER — Encounter: Payer: Self-pay | Admitting: Internal Medicine

## 2016-03-20 VITALS — BP 138/98 | HR 72 | Temp 97.4°F | Ht 61.0 in | Wt 153.0 lb

## 2016-03-20 DIAGNOSIS — I10 Essential (primary) hypertension: Secondary | ICD-10-CM

## 2016-03-20 DIAGNOSIS — M62838 Other muscle spasm: Secondary | ICD-10-CM | POA: Diagnosis not present

## 2016-03-20 DIAGNOSIS — M791 Myalgia: Secondary | ICD-10-CM

## 2016-03-20 DIAGNOSIS — M7918 Myalgia, other site: Secondary | ICD-10-CM

## 2016-03-20 LAB — BASIC METABOLIC PANEL WITH GFR
BUN: 18 mg/dL (ref 7–25)
CHLORIDE: 99 mmol/L (ref 98–110)
CO2: 32 mmol/L — ABNORMAL HIGH (ref 20–31)
Calcium: 9.7 mg/dL (ref 8.6–10.2)
Creat: 0.93 mg/dL (ref 0.50–1.10)
GFR, EST NON AFRICAN AMERICAN: 73 mL/min (ref >=60)
GFR, Est African American: 85 mL/min (ref >=60)
Glucose, Bld: 97 mg/dL (ref 65–99)
Potassium: 3.6 mmol/L (ref 3.5–5.3)
SODIUM: 139 mmol/L (ref 135–146)

## 2016-03-20 MED ORDER — HYDROCHLOROTHIAZIDE 25 MG PO TABS: 25.0000 mg | ORAL_TABLET | Freq: Every day | ORAL | 1 refills | Status: DC

## 2016-03-20 MED ORDER — CYCLOBENZAPRINE HCL 5 MG PO TABS: 5.0000 mg | ORAL_TABLET | Freq: Three times a day (TID) | ORAL | 0 refills | Status: DC | PRN

## 2016-03-20 NOTE — Patient Instructions (Signed)
Ms. Pamela Stewart,  I'm sorry your chest and back still hurt. I recommend flexeril at night (muscle relaxant) and tylenol throughout the day.   Please continue the hydrochlorothiazide 25 mg daily. We may need to change your medication some if even after your pain improves you still have blood pressure above 140/90.   Please return in about 1 month or sooner if needed.  Best, Dr. Sampson GoonFitzgerald
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
## 2016-03-26 ENCOUNTER — Encounter: Payer: Self-pay | Admitting: Internal Medicine

## 2016-04-25 ENCOUNTER — Ambulatory Visit: Payer: BLUE CROSS/BLUE SHIELD | Admitting: Internal Medicine

## 2017-07-30 ENCOUNTER — Other Ambulatory Visit: Payer: Self-pay | Admitting: Internal Medicine

## 2017-07-30 DIAGNOSIS — Z1231 Encounter for screening mammogram for malignant neoplasm of breast: Secondary | ICD-10-CM

## 2017-08-28 ENCOUNTER — Ambulatory Visit
Admission: RE | Admit: 2017-08-28 | Discharge: 2017-08-28 | Disposition: A | Discharge status: Home / Self Care | Payer: BLUE CROSS/BLUE SHIELD | Source: Ambulatory Visit | Attending: Internal Medicine | Admitting: Internal Medicine

## 2017-08-28 DIAGNOSIS — Z1231 Encounter for screening mammogram for malignant neoplasm of breast: Secondary | ICD-10-CM

## 2017-08-29 ENCOUNTER — Other Ambulatory Visit: Payer: Self-pay | Admitting: Internal Medicine

## 2017-08-29 DIAGNOSIS — R928 Other abnormal and inconclusive findings on diagnostic imaging of breast: Secondary | ICD-10-CM

## 2017-10-01 ENCOUNTER — Ambulatory Visit
Admission: RE | Admit: 2017-10-01 | Discharge: 2017-10-01 | Disposition: A | Discharge status: Home / Self Care | Payer: BLUE CROSS/BLUE SHIELD | Source: Ambulatory Visit | Attending: Internal Medicine | Admitting: Internal Medicine

## 2017-10-01 ENCOUNTER — Other Ambulatory Visit: Payer: Self-pay | Admitting: Internal Medicine

## 2017-10-01 DIAGNOSIS — R928 Other abnormal and inconclusive findings on diagnostic imaging of breast: Secondary | ICD-10-CM

## 2017-10-01 DIAGNOSIS — N63 Unspecified lump in unspecified breast: Secondary | ICD-10-CM

## 2018-04-02 ENCOUNTER — Inpatient Hospital Stay
Admission: RE | Admit: 2018-04-02 | Discharge: 2018-04-02 | Disposition: A | Discharge status: Home / Self Care | Payer: BLUE CROSS/BLUE SHIELD | Source: Ambulatory Visit | Attending: Internal Medicine | Admitting: Internal Medicine

## 2018-04-02 ENCOUNTER — Other Ambulatory Visit: Payer: BLUE CROSS/BLUE SHIELD

## 2019-01-21 ENCOUNTER — Encounter (HOSPITAL_COMMUNITY): Payer: Self-pay

## 2019-01-21 ENCOUNTER — Other Ambulatory Visit: Payer: Self-pay

## 2019-01-21 ENCOUNTER — Ambulatory Visit (HOSPITAL_COMMUNITY)
Admission: EM | Admit: 2019-01-21 | Discharge: 2019-01-21 | Disposition: A | Discharge status: Home / Self Care | Payer: HRSA Program | Attending: Family Medicine | Admitting: Family Medicine

## 2019-01-21 DIAGNOSIS — Z20822 Contact with and (suspected) exposure to covid-19: Secondary | ICD-10-CM | POA: Diagnosis not present

## 2019-01-21 DIAGNOSIS — Z6828 Body mass index (BMI) 28.0-28.9, adult: Secondary | ICD-10-CM | POA: Insufficient documentation

## 2019-01-21 DIAGNOSIS — R432 Parageusia: Secondary | ICD-10-CM

## 2019-01-21 DIAGNOSIS — Z823 Family history of stroke: Secondary | ICD-10-CM | POA: Insufficient documentation

## 2019-01-21 DIAGNOSIS — E663 Overweight: Secondary | ICD-10-CM | POA: Insufficient documentation

## 2019-01-21 DIAGNOSIS — Z79899 Other long term (current) drug therapy: Secondary | ICD-10-CM | POA: Diagnosis not present

## 2019-01-21 DIAGNOSIS — R430 Anosmia: Secondary | ICD-10-CM

## 2019-01-21 DIAGNOSIS — I10 Essential (primary) hypertension: Secondary | ICD-10-CM | POA: Insufficient documentation
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
## 2019-01-21 NOTE — Discharge Instructions (Signed)
Covid testing done today with labs pending. As you take precautions until we get your results. Work note provided

## 2019-01-22 LAB — NOVEL CORONAVIRUS, NAA (HOSP ORDER, SEND-OUT TO REF LAB; TAT 18-24 HRS): SARS-CoV-2, NAA: NOT DETECTED

## 2019-01-22 NOTE — ED Provider Notes (Signed)
[place]    CSN: [id number] Arrival date & time: 01/21/19  7902      History   Chief Complaint Chief Complaint  Patient presents with  . No taste/smell    HPI Pamela Stewart is a 49 y.o. female.   Is a 49 year old female with past medical history of depression and hypertension.  She presents today with loss of taste and smell x1 week.  This is been a constant problem but waxing and waning.  Had a positive sick contact due to daughter being positive yesterday.  Has not been taking any medication for her symptoms.  Denies any fever, chills, body aches, cough, chest congestion, diarrhea, chest pain or shortness of breath.  ROS per HPI      Past Medical History:  Diagnosis Date  . Depression   . Hypertension     Patient Active Problem List   Diagnosis Date Noted  . Musculoskeletal pain 02/23/2016  . Vaginal discomfort 11/11/2014  . Back pain 11/11/2014  . Healthcare maintenance 11/11/2014  . Overweight (BMI 25.0-29.9) 11/11/2014  . Headache(784.0) 09/03/2013  . Difficulty sleeping 02/12/2013  . Sciatic pain 08/02/2012  . Reflux 09/19/2010  . Migraine 07/25/2010  . Contraception management 07/08/2010  . AMENORRHEA 01/21/2010  . HTN (hypertension) 01/21/2010  . DEPRESSION 09/20/2007  . HYPERLIPIDEMIA NEC/NOS 04/13/2006    History reviewed. No pertinent surgical history.  OB History   No obstetric history on file.      Home Medications    Prior to Admission medications   Medication Sig Start Date End Date Taking? Authorizing Provider  cyclobenzaprine (FLEXERIL) 5 MG tablet Take 1 tablet (5 mg total) by mouth 3 (three) times daily as needed for muscle spasms. 03/20/16   Rogue Bussing, MD  hydrochlorothiazide (HYDRODIURIL) 25 MG tablet Take 1 tablet (25 mg total) by mouth daily. 03/20/16   Rogue Bussing, MD    Family History Family History  Problem Relation Age of Onset  . Stroke Mother   . Healthy Father     Social  History Social History   Tobacco Use  . Smoking status: Never Smoker  . Smokeless tobacco: Never Used  Substance Use Topics  . Alcohol use: No  . Drug use: No     Allergies   Patient has no known allergies.   Review of Systems Review of Systems   Physical Exam Triage Vital Signs ED Triage Vitals  Enc Vitals Group     BP 01/21/19 1123 (!) 155/111     Pulse Rate 01/21/19 1123 76     Resp 01/21/19 1123 18     Temp 01/21/19 1123 (!) 97 F (36.1 C)     Temp Source 01/21/19 1123 Oral     SpO2 01/21/19 1123 100 %     Weight 01/21/19 1120 150 lb (68 kg)     Height --      Head Circumference --      Peak Flow --      Pain Score 01/21/19 1120 6     Pain Loc --      Pain Edu? --      Excl. in [place]? --    No data found.  Updated Vital Signs BP (!) 155/111 (BP Location: Left Arm)   Pulse 76   Temp (!) 97 F (36.1 C) (Oral)   Resp 18   Wt 150 lb (68 kg)   LMP 12/31/2018   SpO2 100%   BMI 28.34 kg/m   Visual Acuity  Right Eye Distance:   Left Eye Distance:   Bilateral Distance:    Right Eye Near:   Left Eye Near:    Bilateral Near:     Physical Exam Vitals and nursing note reviewed.  Constitutional:      General: She is not in acute distress.    Appearance: Normal appearance. She is not ill-appearing, toxic-appearing or diaphoretic.  HENT:     Head: Normocephalic.     Nose: Nose normal.     Mouth/Throat:     Pharynx: Oropharynx is clear.  Eyes:     Conjunctiva/sclera: Conjunctivae normal.  Pulmonary:     Effort: Pulmonary effort is normal.  Musculoskeletal:        General: Normal range of motion.     Cervical back: Normal range of motion.  Skin:    General: Skin is warm and dry.     Findings: No rash.  Neurological:     Mental Status: She is alert.  Psychiatric:        Mood and Affect: Mood normal.      UC Treatments / Results  Labs (all labs ordered are listed, but only abnormal results are displayed) Labs Reviewed  NOVEL CORONAVIRUS, NAA  (HOSP ORDER, SEND-OUT TO REF LAB; TAT 18-24 HRS)    EKG   Radiology No results found.  Procedures Procedures (including critical care time)  Medications Ordered in UC Medications - No data to display  Initial Impression / Assessment and Plan / UC Course  I have reviewed the triage vital signs and the nursing notes.  Pertinent labs & imaging results that were available during my care of the patient were reviewed by me and considered in my medical decision making (see chart for details).     Loss of taste and smell with Covid exposure-Covid swab sent for testing with labs pending. Quarantine precautions and work note given Final Clinical Impressions(s) / UC Diagnoses   Final diagnoses:  Loss of taste  Loss of smell     Discharge Instructions     Covid testing done today with labs pending. As you take precautions until we get your results. Work note provided    ED Prescriptions    None     PDMP not reviewed this encounter.   Janace Aris, NP 01/22/19 1317

## 2020-05-08 ENCOUNTER — Emergency Department (HOSPITAL_COMMUNITY)
Admission: EM | Admit: 2020-05-08 | Discharge: 2020-05-08 | Disposition: A | Discharge status: Home / Self Care | Payer: 59 | Attending: Emergency Medicine | Admitting: Emergency Medicine

## 2020-05-08 ENCOUNTER — Emergency Department (HOSPITAL_COMMUNITY): Payer: 59

## 2020-05-08 ENCOUNTER — Encounter (HOSPITAL_COMMUNITY): Payer: Self-pay

## 2020-05-08 ENCOUNTER — Other Ambulatory Visit: Payer: Self-pay

## 2020-05-08 DIAGNOSIS — I10 Essential (primary) hypertension: Secondary | ICD-10-CM | POA: Insufficient documentation

## 2020-05-08 DIAGNOSIS — S299XXA Unspecified injury of thorax, initial encounter: Secondary | ICD-10-CM | POA: Diagnosis present

## 2020-05-08 DIAGNOSIS — Z79899 Other long term (current) drug therapy: Secondary | ICD-10-CM | POA: Diagnosis not present

## 2020-05-08 DIAGNOSIS — S301XXA Contusion of abdominal wall, initial encounter: Secondary | ICD-10-CM | POA: Diagnosis not present

## 2020-05-08 DIAGNOSIS — M542 Cervicalgia: Secondary | ICD-10-CM | POA: Insufficient documentation

## 2020-05-08 DIAGNOSIS — E119 Type 2 diabetes mellitus without complications: Secondary | ICD-10-CM | POA: Diagnosis not present

## 2020-05-08 DIAGNOSIS — S0990XA Unspecified injury of head, initial encounter: Secondary | ICD-10-CM | POA: Insufficient documentation

## 2020-05-08 DIAGNOSIS — M549 Dorsalgia, unspecified: Secondary | ICD-10-CM | POA: Diagnosis not present

## 2020-05-08 DIAGNOSIS — S20219A Contusion of unspecified front wall of thorax, initial encounter: Secondary | ICD-10-CM | POA: Insufficient documentation

## 2020-05-08 DIAGNOSIS — Y92410 Unspecified street and highway as the place of occurrence of the external cause: Secondary | ICD-10-CM | POA: Insufficient documentation

## 2020-05-08 HISTORY — DX: Type 2 diabetes mellitus without complications: E11.9

## 2020-05-08 LAB — CBC WITH DIFFERENTIAL/PLATELET
Abs Immature Granulocytes: 0.04 10*3/uL (ref 0.00–0.07)
Basophils Absolute: 0 10*3/uL (ref 0.0–0.1)
Basophils Relative: 0 %
Eosinophils Absolute: 0.1 10*3/uL (ref 0.0–0.5)
Eosinophils Relative: 2 %
HCT: 37.2 % (ref 36.0–46.0)
Hemoglobin: 11.7 g/dL — ABNORMAL LOW (ref 12.0–15.0)
Immature Granulocytes: 1 %
Lymphocytes Relative: 23 %
Lymphs Abs: 1.4 10*3/uL (ref 0.7–4.0)
MCH: 24.2 pg — ABNORMAL LOW (ref 26.0–34.0)
MCHC: 31.5 g/dL (ref 30.0–36.0)
MCV: 76.9 fL — ABNORMAL LOW (ref 80.0–100.0)
Monocytes Absolute: 0.3 10*3/uL (ref 0.1–1.0)
Monocytes Relative: 6 %
Neutro Abs: 4.2 10*3/uL (ref 1.7–7.7)
Neutrophils Relative %: 68 %
Platelets: 209 10*3/uL (ref 150–400)
RBC: 4.84 MIL/uL (ref 3.87–5.11)
RDW: 14.4 % (ref 11.5–15.5)
WBC: 6.1 10*3/uL (ref 4.0–10.5)
nRBC: 0 % (ref 0.0–0.2)

## 2020-05-08 LAB — COMPREHENSIVE METABOLIC PANEL
ALT: 24 U/L (ref 0–44)
AST: 25 U/L (ref 15–41)
Albumin: 4 g/dL (ref 3.5–5.0)
Alkaline Phosphatase: 64 U/L (ref 38–126)
Anion gap: 11 (ref 5–15)
BUN: 11 mg/dL (ref 6–20)
CO2: 26 mmol/L (ref 22–32)
Calcium: 9.3 mg/dL (ref 8.9–10.3)
Chloride: 104 mmol/L (ref 98–111)
Creatinine, Ser: 1.13 mg/dL — ABNORMAL HIGH (ref 0.44–1.00)
GFR, Estimated: 59 mL/min — ABNORMAL LOW (ref >60)
Glucose, Bld: 107 mg/dL — ABNORMAL HIGH (ref 70–99)
Potassium: 4 mmol/L (ref 3.5–5.1)
Sodium: 141 mmol/L (ref 135–145)
Total Bilirubin: 0.3 mg/dL (ref 0.3–1.2)
Total Protein: 7.4 g/dL (ref 6.5–8.1)

## 2020-05-08 LAB — PROTIME-INR
INR: 1 (ref 0.8–1.2)
Prothrombin Time: 12.9 seconds (ref 11.4–15.2)

## 2020-05-08 LAB — TROPONIN I (HIGH SENSITIVITY): Troponin I (High Sensitivity): 4 ng/L (ref <18)

## 2020-05-08 LAB — I-STAT BETA HCG BLOOD, ED (MC, WL, AP ONLY): I-stat hCG, quantitative: <5 m[IU]/mL (ref <5)

## 2020-05-08 MED ORDER — IBUPROFEN 400 MG PO TABS
600.0000 mg | ORAL_TABLET | Freq: Once | ORAL | Status: AC
  Administered 2020-05-08: 600 mg via ORAL
  Filled 2020-05-08: qty 1

## 2020-05-08 MED ORDER — IOHEXOL 300 MG/ML  SOLN
100.0000 mL | Freq: Once | INTRAMUSCULAR | Status: AC | PRN
  Administered 2020-05-08: 100 mL via INTRAVENOUS

## 2020-05-08 MED ORDER — MORPHINE SULFATE (PF) 4 MG/ML IV SOLN
4.0000 mg | Freq: Once | INTRAVENOUS | Status: AC
  Administered 2020-05-08: 4 mg via INTRAVENOUS
  Filled 2020-05-08: qty 1

## 2020-05-08 NOTE — Discharge Instructions (Signed)
Your common bile duct was dilated on the CT scan today.  You will need an outpatient ultrasound that your primary care physician can order.  If you develop new or worsening chest pain, shortness of breath, headache, vomiting, or any other new/concerning symptoms then return to the ER for evaluation.  You may take ibuprofen and Tylenol for pain.  ?ng m?t ch? c?a b?n ? b? gin khi ch?p CT hm nay. B?n s? c?n siu m cho b?nh nhn ngo?i tr m bc s? ch?m [place] chnh c?a b?n c th? yu c?u.  N?u b?n pht tri?n c?n ?au ng?c m?i ho?c tr?m tr?ng h?n, kh th?, ?au ??u, nn m?a ho?c b?t k? tri?u ch?ng m?i / lin quan no khc, hy quay l?i Phng khm c?p c?u ?? ?nh gi.  B?n c th? dng ibuprofen v Tylenol ?? gi?m ?au.
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
## 2020-06-28 ENCOUNTER — Other Ambulatory Visit: Payer: Self-pay

## 2020-06-28 ENCOUNTER — Encounter: Payer: Self-pay | Admitting: Family Medicine

## 2020-06-28 ENCOUNTER — Ambulatory Visit: Payer: 59 | Admitting: Family Medicine

## 2020-06-28 VITALS — BP 137/85 | HR 87 | Ht 60.0 in | Wt 145.8 lb

## 2020-06-28 DIAGNOSIS — G43819 Other migraine, intractable, without status migrainosus: Secondary | ICD-10-CM

## 2020-06-28 MED ORDER — NAPROXEN 500 MG PO TABS: 500.0000 mg | ORAL_TABLET | Freq: Two times a day (BID) | ORAL | 0 refills | Status: DC

## 2020-06-28 MED ORDER — METOPROLOL TARTRATE 25 MG PO TABS: 25.0000 mg | ORAL_TABLET | Freq: Two times a day (BID) | ORAL | 3 refills | Status: DC

## 2020-06-28 NOTE — Progress Notes (Signed)
   Subjective:    Patient ID: Pamela Stewart, female    DOB: 01-Dec-1970, 50 y.o.   MRN: [id number]   CC: Establish care  HPI:  Pamela Stewart is a very pleasant 50 y.o. female who presents today to establish care.  Initial concerns:  Headache Ongoing for 2-3 days. On right side of head and face. Has tearing of the left eye. Feels like a stabbing pain. Hx of migraines usually treated with advil and tylenol. Has had little relief with these medications at this time. She is troubled by not being able to sleep. Denies numbness, nausea, and vomiting.   Past medical history: diabetes, hypertension, hyperlipidemia, GERD, depression  Past surgical history: Tonsillectomy, year unaware > 10 years ago  Current medications: Metformin twice daily, olmesartan 40 mg daily  Family history: Hx of cancer (Parents (deceased), Brother) unsure of what type of cancer  Social history: Denies alcohol, tobacco, drug use  ROS: pertinent noted in the HPI   Objective:  BP 137/85   Pulse 87   Ht 5' (1.524 m)   Wt 145 lb 12.8 oz (66.1 kg)   SpO2 99%   BMI 28.47 kg/m   Vitals and nursing note reviewed  General: NAD, pleasant, able to participate in exam Cardiac: RRR, S1 S2 present. normal heart sounds, no murmurs. Respiratory: CTAB, normal effort, No wheezes, rales or rhonchi Abdomen: Bowel sounds present, non-tender, non-distended, no hepatosplenomegaly Extremities: no edema or cyanosis. Skin: warm and dry, no rashes noted Neuro:  CN II: PERRL CN III, IV,VI: EOMI CV V: Normal sensation in V1, V2, V3 CVII: Symmetric smile and brow raise CN VIII: Normal hearing CN IX,X: Symmetric palate raise  CN XI: 5/5 shoulder shrug CN XII: Symmetric tongue protrusion  UE and LE strength 5/5 2+ UE and LE reflexes  Normal sensation in UE and LE bilaterally  Psych: Normal affect and mood   Assessment & Plan:   Migraine Acute on chronic. No relief with usual therapy. Neuro exam reassuring. Low suspicion for  temporal arteritis at this time. Tried nortriptyline in the past without relief. Consider short course of NSAID for pain relief and start beta-blocker for prophylaxis.  -Start Naproxen 500 mg BID x7 days -Start metoprolol 25 mg BID (added benefit for HTN) -Follow up in 1 week or sooner if needed -ED precautions given  Pamela Jumbo, DO [facility] PGY-2

## 2020-06-28 NOTE — Patient Instructions (Addendum)
It was wonderful to see you today.  Please bring ALL of your medications with you to every visit.   Today we talked about:  Migraines we will try metoprolol and naproxen.   We discussed when to go to ED.   Please be sure to schedule follow up in 1 week at the front  desk before you leave today.   If you haven't already, sign up for My Chart to have easy access to your labs results, and communication with your primary care physician.  Please call the clinic at [phone] if your symptoms worsen or you have any concerns. It was our pleasure to serve you.  Dr. Salvadore Dom

## 2020-06-29 ENCOUNTER — Encounter: Payer: Self-pay | Admitting: Family Medicine

## 2020-06-29 NOTE — Assessment & Plan Note (Addendum)
Acute on chronic. No relief with usual therapy. Neuro exam reassuring. Low suspicion for temporal arteritis at this time. Tried nortriptyline in the past without relief. Consider short course of NSAID for pain relief and start beta-blocker for prophylaxis.  -Start Naproxen 500 mg BID x7 days -Start metoprolol 25 mg BID (added benefit for HTN) -Follow up in 1 week or sooner if needed -ED precautions given

## 2020-07-05 ENCOUNTER — Ambulatory Visit: Payer: 59

## 2020-08-11 ENCOUNTER — Other Ambulatory Visit: Payer: Self-pay | Admitting: Family Medicine

## 2020-08-11 DIAGNOSIS — G43819 Other migraine, intractable, without status migrainosus: Secondary | ICD-10-CM

## 2020-08-15 ENCOUNTER — Other Ambulatory Visit: Payer: Self-pay | Admitting: Family Medicine

## 2020-08-15 DIAGNOSIS — G43819 Other migraine, intractable, without status migrainosus: Secondary | ICD-10-CM

## 2020-08-18 ENCOUNTER — Encounter: Payer: Self-pay | Admitting: Family Medicine

## 2020-08-18 ENCOUNTER — Other Ambulatory Visit: Payer: Self-pay

## 2020-08-18 ENCOUNTER — Ambulatory Visit: Payer: 59 | Admitting: Family Medicine

## 2020-08-18 VITALS — BP 165/70 | HR 64 | Ht 62.0 in | Wt 145.6 lb

## 2020-08-18 DIAGNOSIS — R7989 Other specified abnormal findings of blood chemistry: Secondary | ICD-10-CM | POA: Diagnosis not present

## 2020-08-18 DIAGNOSIS — Z1159 Encounter for screening for other viral diseases: Secondary | ICD-10-CM | POA: Diagnosis not present

## 2020-08-18 DIAGNOSIS — G43819 Other migraine, intractable, without status migrainosus: Secondary | ICD-10-CM | POA: Diagnosis not present

## 2020-08-18 DIAGNOSIS — I10 Essential (primary) hypertension: Secondary | ICD-10-CM

## 2020-08-18 MED ORDER — NAPROXEN 500 MG PO TABS: ORAL_TABLET | ORAL | 0 refills | Status: DC

## 2020-08-18 NOTE — Patient Instructions (Addendum)
For the headaches we discussed refill of naproxen. This is not to be taken long term. Please do not take with advil. Continue metoprolol. After finishing naproxen try tylenol for headache relief. You can take 650 mg every 6 hours as needed for relief.  Continue taking your blood pressure medication as prescribed.   You can try nasal saline/ flonase for nasal congestion. Refresh for dry eyes.  If headaches continue please follow up. Otherwise schedule your physical today.   Dr. Salvadore Dom

## 2020-08-18 NOTE — Progress Notes (Signed)
    SUBJECTIVE:   CHIEF COMPLAINT / HPI:   Ms. Titzer is a 50 yo F who presents with her daughter for the below.   Hx of migraines, follow up Since last visit has had improvement with pain control and decrease in frequency. Since finishing short course of naproxen feels as if the headaches have returned. Admits to having some nasal congestion. She has taken tylenol and advil with some improvement in symptoms. Denies vision changes, chest pain, shortness of breath, loss of sensation/numbess/tingling. Would like to continue naproxen since this was working so well.   Hx of Elevated serum creatinine Need to repeat lab especially in the setting of continued NSAID use.   HTN Did not take blood pressure medication this morning. Unaware that metoprolol is for both blood pressure and migraine prophylaxis.   Screening for Viral disease Need Hep C screening  PERTINENT  PMH / PSH: Depression (PHQ9 score 11), Hx of sleeping difficulty  OBJECTIVE:   BP (!) 165/70   Pulse 64   Ht 5\' 2"  (1.575 m)   Wt 145 lb 9.6 oz (66 kg)   SpO2 98%   BMI 26.63 kg/m   General: Appears well, no acute distress. Age appropriate. HEENT: White sclera. Tender to palpation frontal and maxillary sinuses. Cardiac: RRR, normal heart sounds, no murmurs Respiratory: CTAB, normal effort  ASSESSMENT/PLAN:   Migraine Without current migraine. Has decreased in frequency since initiation of BB. Desired pain relief from naproxen desires. Discussed this is not a long term solution however will refill today. Encouraged continued use of BB for HTN and migraine ppx. Discussed possible component of headache due to nasal congestion with tender sinuses. Can use saline/flonase. Monitor for improvement. Instructed to follow up if no further improvement after NSAID use. Suggested tylenol for more long term than an NSAID. Also consider sleep study and addressing depression at follow up. -Scheduled physical exam.   HTN  (hypertension) Elevated today. Asymptomatic. Did not take BP medication this morning. Previously normotensive last visit. Instructed to take medication as prescribed. Will continue to monitor at subsequent visit.   Elevated serum creatinine Cr 1.13 05/08/2020 with previous normal baseline. Chronic use of NSAIDS will repeat BMP today. Consider premature discontinuation of naproxen if uptrending.  - Basic Metabolic Panel  Screening for viral disease - Hepatitis C antibody (reflex, frozen specimen)  05/10/2020, DO [facility]

## 2020-08-19 LAB — BASIC METABOLIC PANEL
BUN/Creatinine Ratio: 10 (ref 9–23)
BUN: 10 mg/dL (ref 6–24)
CO2: 23 mmol/L (ref 20–29)
Calcium: 9.5 mg/dL (ref 8.7–10.2)
Chloride: 104 mmol/L (ref 96–106)
Creatinine, Ser: 1.03 mg/dL — ABNORMAL HIGH (ref 0.57–1.00)
Glucose: 99 mg/dL (ref 65–99)
Potassium: 5 mmol/L (ref 3.5–5.2)
Sodium: 143 mmol/L (ref 134–144)
eGFR: 66 mL/min/{1.73_m2} (ref >59)

## 2020-08-19 LAB — HCV INTERPRETATION

## 2020-08-19 LAB — HCV AB W REFLEX TO QUANT PCR: HCV Ab: <0.1 s/co ratio (ref 0.0–0.9)

## 2020-08-21 NOTE — Assessment & Plan Note (Addendum)
Without current migraine. Has decreased in frequency since initiation of BB. Desired pain relief from naproxen desires. Discussed this is not a long term solution however will refill today. Encouraged continued use of BB for HTN and migraine ppx. Discussed possible component of headache due to nasal congestion with tender sinuses. Can use saline/flonase. Monitor for improvement. Instructed to follow up if no further improvement after NSAID use. Suggested tylenol for more long term than an NSAID. Also consider sleep study and addressing depression at follow up. -Consider mag ox for tx as well -Schedule physical exam.

## 2020-08-21 NOTE — Assessment & Plan Note (Signed)
Elevated today. Asymptomatic. Did not take BP medication this morning. Previously normotensive last visit. Instructed to take medication as prescribed. Will continue to monitor at subsequent visit.

## 2020-09-05 ENCOUNTER — Other Ambulatory Visit: Payer: Self-pay | Admitting: Family Medicine

## 2020-09-05 DIAGNOSIS — G43819 Other migraine, intractable, without status migrainosus: Secondary | ICD-10-CM

## 2020-11-24 NOTE — Progress Notes (Deleted)
    SUBJECTIVE:   CHIEF COMPLAINT / HPI: HA, rashes   Patient presents with [place] interpreter. Reporting rash on her hands that is pruritic and scaling in addition to a HA. Patient states this has been going on since ***   PERTINENT  PMH / PSH: ***  OBJECTIVE:   There were no vitals taken for this visit.  ***  ASSESSMENT/PLAN:   No problem-specific Assessment & Plan notes found for this encounter.     Ronnald Ramp, MD [facility]

## 2020-11-25 ENCOUNTER — Ambulatory Visit: Payer: 59

## 2020-11-29 ENCOUNTER — Other Ambulatory Visit: Payer: Self-pay | Admitting: Family Medicine

## 2020-11-29 DIAGNOSIS — G43819 Other migraine, intractable, without status migrainosus: Secondary | ICD-10-CM

## 2021-02-02 NOTE — Progress Notes (Signed)
° ° °  SUBJECTIVE:   CHIEF COMPLAINT / HPI:   Daughter acting as [place] interpreter today, form signed.   Discuss Citizenship [id number]) Nichol Schaber is a 51 y.o. female who presents to the family medicine clinic today accompanied by her daughter who interprets for her to discuss [place]-648 form.  Daughter notes that in order for patient to receive her citizenship she needs to be able to demonstrate understanding the [place] language including ability to read, write, speak.  Patient states that she is able to speak little bit of English but "it is not too clear".  She works as a Engineer, civil (consulting).  Her only medical history is notable for type 2 diabetes, hypertension, migraines.  She denies history of PTSD, depression, renal disease.  She currently holds a driver's license.  She is on medication for her high blood pressure and migraine prophylaxis.  Hypertension Elevated BP today. Reports good compliance with her medications. Does not have BP cuff at home but would like to get one.  Walks for exercise.   History of T2DM  Reports history of type 2 diabetes.  Unable to see any A1c's here.  She is currently taking metformin 500 mg twice daily.  Reports this was diagnosed at [place], her previous PCP.  PERTINENT  PMH / PSH:  Past Medical History:  Diagnosis Date   Depression    Diabetes mellitus without complication (HCC)    Hypertension    OBJECTIVE:   BP (!) 160/90    Pulse 70    Ht _0  (1.575 m)    Wt 155 lb 9.6 oz (70.6 kg)    SpO2 99%    BMI 28.46 kg/m    General: NAD, pleasant, able to participate in exam Cardiac: RRR, no murmurs. Respiratory: CTAB, normal effort, No wheezes, rales or rhonchi Extremities: no edema or cyanosis. Skin: warm and dry, no rashes noted Neuro: alert, no obvious focal deficits Psych: Normal affect and mood  ASSESSMENT/PLAN:   Immigrant with language difficulty Screening for [place]-648 form completed today, placed both screening form and N-648 in Dr. Saul Fordyce  box. Discussed that there is typically a several month turn-around for these forms.    HTN (hypertension) Blood pressure elevated today.  Reports good compliance on olmesartan 40 mg and Lopressor 25 mg. -BP kit sent to pharmacy -Starting patient on 5 mg amlodipine -Follow-up in 2 weeks with PCP; patient instructed to bring log of BPs  Healthcare maintenance Referral to GI placed for screening colonoscopy.  Mammogram ordered, patient given contact information to call to schedule.  Shingles vaccine sent to pharmacy.  Received COVID booster today.  Given that we have no insurance on file, discussed with patient about receiving [facility].  History of diabetes mellitus Reports history of type 2 diabetes, I am unable to confirm this today. A1c 6.4 today, could indicated good control if she does have diabetes, or pre-diabetes. -Release of Medical Records to obtain records from previous provider -Continue Metformin 500 mg BID     Sharion Settler, [place]

## 2021-02-03 ENCOUNTER — Ambulatory Visit (INDEPENDENT_AMBULATORY_CARE_PROVIDER_SITE_OTHER): Payer: Self-pay | Admitting: Family Medicine

## 2021-02-03 ENCOUNTER — Other Ambulatory Visit: Payer: Self-pay

## 2021-02-03 ENCOUNTER — Ambulatory Visit (INDEPENDENT_AMBULATORY_CARE_PROVIDER_SITE_OTHER): Payer: Self-pay

## 2021-02-03 ENCOUNTER — Encounter: Payer: Self-pay | Admitting: Family Medicine

## 2021-02-03 VITALS — BP 160/90 | HR 70 | Ht 62.0 in | Wt 155.6 lb

## 2021-02-03 DIAGNOSIS — Z1211 Encounter for screening for malignant neoplasm of colon: Secondary | ICD-10-CM

## 2021-02-03 DIAGNOSIS — E663 Overweight: Secondary | ICD-10-CM

## 2021-02-03 DIAGNOSIS — Z23 Encounter for immunization: Secondary | ICD-10-CM

## 2021-02-03 DIAGNOSIS — Z603 Acculturation difficulty: Secondary | ICD-10-CM | POA: Insufficient documentation

## 2021-02-03 DIAGNOSIS — Z0000 Encounter for general adult medical examination without abnormal findings: Secondary | ICD-10-CM

## 2021-02-03 DIAGNOSIS — Z1231 Encounter for screening mammogram for malignant neoplasm of breast: Secondary | ICD-10-CM

## 2021-02-03 DIAGNOSIS — Z8639 Personal history of other endocrine, nutritional and metabolic disease: Secondary | ICD-10-CM | POA: Insufficient documentation

## 2021-02-03 DIAGNOSIS — I10 Essential (primary) hypertension: Secondary | ICD-10-CM

## 2021-02-03 LAB — POCT GLYCOSYLATED HEMOGLOBIN (HGB A1C): Hemoglobin A1C: 6.3 % — AB (ref 4.0–5.6)

## 2021-02-03 MED ORDER — ZOSTER VAC RECOMB ADJUVANTED 50 MCG/0.5ML IM SUSR: 0.5000 mL | Freq: Once | INTRAMUSCULAR | 0 refills | Status: AC

## 2021-02-03 MED ORDER — BLOOD PRESSURE KIT: 1.0000 | PACK | Freq: Every day | 0 refills | Status: AC

## 2021-02-03 MED ORDER — AMLODIPINE BESYLATE 5 MG PO TABS: 5.0000 mg | ORAL_TABLET | Freq: Every day | ORAL | 3 refills | Status: DC

## 2021-02-03 NOTE — Assessment & Plan Note (Signed)
Reports history of type 2 diabetes, I am unable to confirm this today. A1c 6.4 today, could indicated good control if she does have diabetes, or pre-diabetes. -Release of Medical Records to obtain records from previous provider -Continue Metformin 500 mg BID

## 2021-02-03 NOTE — Assessment & Plan Note (Signed)
Screening for [place]-648 form completed today, placed both screening form and N-648 in Dr. Theora Gianotti box. Discussed that there is typically a several month turn-around for these forms.

## 2021-02-03 NOTE — Assessment & Plan Note (Signed)
Blood pressure elevated today.  Reports good compliance on olmesartan 40 mg and Lopressor 25 mg. °-BP kit sent to pharmacy °-Starting patient on 5 mg amlodipine °-Follow-up in 2 weeks with PCP; patient instructed to bring log of BPs °

## 2021-02-03 NOTE — Assessment & Plan Note (Signed)
Referral to GI placed for screening colonoscopy.  Mammogram ordered, patient given contact information to call to schedule.  Shingles vaccine sent to pharmacy.  Received COVID booster today.  Given that we have no insurance on file, discussed with patient about [facility].

## 2021-02-03 NOTE — Patient Instructions (Addendum)
It was wonderful to see you today.  Please bring ALL of your medications with you to every visit.   Today we talked about:  -Try some of the back exercises shown below. -You can use heat pads and also use Voltaren gel as needed to the areas of pain. -Your diabetes seems to be well controlled, which is good. -Your blood pressure is up today, we are starting you on a new medication called Amlodipine. Take it once a day.  -Come back to see your primary care provider on February 1st at 1030 for a blood pressure check. -I sent a prescription for a blood pressure kit, this may or may not be covered by insurance. I would recommend checking your blood pressure once in the morning and once in the evening. Write the numbers down, bring them with you to your next appointment. -Try to avoid adding any extra salt to your foods. -I placed a referral to Gastroenterology for screening colonoscopy. -I have sent a prescription for the Shingles vaccine to your pharmacy. -She received the COVID booster shot today. [facility]. -I ordered a mammogram for breast cancer screening.  [facility], [place], [place] [address] Call [phone] to schedule an appointment   Thank you for choosing [facility].   Please call [phone] with any questions about today's appointment.  Please be sure to schedule follow up at the front  desk before you leave today.   Sharion Settler, DO PGY-2 Family Medicine    Back Exercises These exercises help to make your trunk and back strong. They also help to keep the lower back flexible. Doing these exercises can help to prevent or lessen pain in your lower back. If you have back pain, try to do these exercises 2-3 times each day or as told by your doctor. As you get better, do the exercises once each day. Repeat the exercises more often as told by your doctor. To stop back pain from coming back, do the exercises once each day,  or as told by your doctor. Do exercises exactly as told by your doctor. Stop right away if you feel sudden pain or your pain gets worse. Exercises Single knee to chest Do these steps 3-5 times in a row for each leg: Lie on your back on a firm bed or the floor with your legs stretched out. Bring one knee to your chest. Grab your knee or thigh with both hands and hold it in place. Pull on your knee until you feel a gentle stretch in your lower back or butt. Keep doing the stretch for 10-30 seconds. Slowly let go of your leg and straighten it. Pelvic tilt Do these steps 5-10 times in a row: Lie on your back on a firm bed or the floor with your legs stretched out. Bend your knees so they point up to the ceiling. Your feet should be flat on the floor. Tighten your lower belly (abdomen) muscles to press your lower back against the floor. This will make your tailbone point up to the ceiling instead of pointing down to your feet or the floor. Stay in this position for 5-10 seconds while you gently tighten your muscles and breathe evenly. Cat-cow Do these steps until your lower back bends more easily: Get on your hands and knees on a firm bed or the floor. Keep your hands under your shoulders, and keep your knees under your hips. You may put padding  under your knees. Let your head hang down toward your chest. Tighten (contract) the muscles in your belly. Point your tailbone toward the floor so your lower back becomes rounded like the back of a cat. Stay in this position for 5 seconds. Slowly lift your head. Let the muscles of your belly relax. Point your tailbone up toward the ceiling so your back forms a sagging arch like the back of a cow. Stay in this position for 5 seconds.  Press-ups Do these steps 5-10 times in a row: Lie on your belly (face-down) on a firm bed or the floor. Place your hands near your head, about shoulder-width apart. While you keep your back relaxed and keep your hips on  the floor, slowly straighten your arms to raise the top half of your body and lift your shoulders. Do not use your back muscles. You may change where you place your hands to make yourself more comfortable. Stay in this position for 5 seconds. Keep your back relaxed. Slowly return to lying flat on the floor.  Bridges Do these steps 10 times in a row: Lie on your back on a firm bed or the floor. Bend your knees so they point up to the ceiling. Your feet should be flat on the floor. Your arms should be flat at your sides, next to your body. Tighten your butt muscles and lift your butt off the floor until your waist is almost as high as your knees. If you do not feel the muscles working in your butt and the back of your thighs, slide your feet 1-2 inches (2.5-5 cm) farther away from your butt. Stay in this position for 3-5 seconds. Slowly lower your butt to the floor, and let your butt muscles relax. If this exercise is too easy, try doing it with your arms crossed over your chest. Belly crunches Do these steps 5-10 times in a row: Lie on your back on a firm bed or the floor with your legs stretched out. Bend your knees so they point up to the ceiling. Your feet should be flat on the floor. Cross your arms over your chest. Tip your chin a little bit toward your chest, but do not bend your neck. Tighten your belly muscles and slowly raise your chest just enough to lift your shoulder blades a tiny bit off the floor. Avoid raising your body higher than that because it can put too much stress on your lower back. Slowly lower your chest and your head to the floor. Back lifts Do these steps 5-10 times in a row: Lie on your belly (face-down) with your arms at your sides, and rest your forehead on the floor. Tighten the muscles in your legs and your butt. Slowly lift your chest off the floor while you keep your hips on the floor. Keep the back of your head in line with the curve in your back. Look at the  floor while you do this. Stay in this position for 3-5 seconds. Slowly lower your chest and your face to the floor. Contact a doctor if: Your back pain gets a lot worse when you do an exercise. Your back pain does not get better within 2 hours after you exercise. If you have any of these problems, stop doing the exercises. Do not do them again unless your doctor says it is okay. Get help right away if: You have sudden, very bad back pain. If this happens, stop doing the exercises. Do not do them again  unless your doctor says it is okay. This information is not intended to replace advice given to you by your health care provider. Make sure you discuss any questions you have with your health care provider. Document Revised: 03/17/2020 Document Reviewed: 03/17/2020 Elsevier Patient Education  [place].

## 2021-02-16 ENCOUNTER — Encounter: Payer: Self-pay | Admitting: Family Medicine

## 2021-02-16 ENCOUNTER — Ambulatory Visit (INDEPENDENT_AMBULATORY_CARE_PROVIDER_SITE_OTHER): Payer: Self-pay | Admitting: Family Medicine

## 2021-02-16 ENCOUNTER — Other Ambulatory Visit: Payer: Self-pay

## 2021-02-16 ENCOUNTER — Telehealth: Payer: Self-pay | Admitting: Family Medicine

## 2021-02-16 VITALS — BP 148/97 | HR 89 | Wt 156.8 lb

## 2021-02-16 DIAGNOSIS — H938X3 Other specified disorders of ear, bilateral: Secondary | ICD-10-CM

## 2021-02-16 DIAGNOSIS — I10 Essential (primary) hypertension: Secondary | ICD-10-CM

## 2021-02-16 MED ORDER — AMLODIPINE BESYLATE 5 MG PO TABS: 10.0000 mg | ORAL_TABLET | Freq: Every day | ORAL | 3 refills | Status: DC

## 2021-02-16 NOTE — Telephone Encounter (Signed)
Spoke with daughter about being denied by USCIS. She is unsure whether her mother has memory impairment. We will plan to follow up on this 2/9 visit.   Lavonda Jumbo, DO 02/16/2021, 4:14 PM PGY-3, [facility]

## 2021-02-16 NOTE — Progress Notes (Signed)
° ° °  SUBJECTIVE:   CHIEF COMPLAINT / HPI:   Ms. Cadden is a 51 yo F who presents with her daughter for follow up.   Hypertension - Medications: Amlodipine 5 mg nightly, metoprolol 25 mg twice daily, olmesartan 40 mg daily - Compliance: Yes - Checking BP at home: Yes, continues to have some diastolics in the 100s - Denies any SOB, CP, vision changes, LE edema, medication SEs, or symptoms of hypotension  Sick symptoms following COVID booster Noticed congestion, itchy eyes, sore throat following COVID booster last week.  States that she is starting to feel better but is having some ear pain.  Denies fever, cough, shortness of breath.  PERTINENT  PMH / PSH: HLD, DM v. Pre-diabetes (see 01/24/21 note)  OBJECTIVE:   BP (!) 148/97    Pulse 89    Wt 156 lb 12.8 oz (71.1 kg)    SpO2 100%    BMI 28.68 kg/m   Physical Exam Vitals reviewed.  Constitutional:      General: She is not in acute distress.    Appearance: Normal appearance. She is not ill-appearing.  HENT:     Head: Normocephalic.     Right Ear: Tympanic membrane, ear canal and external ear normal.     Left Ear: Tympanic membrane, ear canal and external ear normal.     Ears:     Comments: Small amount of congestion behind TM Eyes:     Conjunctiva/sclera: Conjunctivae normal.  Cardiovascular:     Heart sounds: Normal heart sounds.  Pulmonary:     Effort: Pulmonary effort is normal.     Breath sounds: Normal breath sounds.  Neurological:     Mental Status: She is alert and oriented to person, place, and time.  Psychiatric:        Mood and Affect: Mood normal.        Behavior: Behavior normal.     ASSESSMENT/PLAN:   1. Primary hypertension Elevated today.  Asymptomatic.  Will increase amlodipine to 10 mg and continue other current medications.  Follow-up in 1-2 weeks for blood pressure recheck. - amLODipine (NORVASC) 5 MG tablet; Take 2 tablets (10 mg total) by mouth at bedtime.  Dispense: 90 tablet; Refill: 3  2.  Congestion of both ears Patient with ear congestion, itchy eyes, and sore throat 2 days after COVID booster.  States that this occurred with her last COVID booster.  She is feeling a little bit better today.  Question whether there is a seasonal allergy component versus immunological reaction to vaccine.  Suggest supportive care in Zyrtec daily.  Follow-up as needed or if treatment fails to improve.     Lavonda Jumbo, DO [facility]

## 2021-02-16 NOTE — Patient Instructions (Signed)
Thank you for coming today!  We increased your amlodipine to 10 mg daily. Continue to take your other medications as prescribed.   Follow up in 1-2 weeks for a blood pressure recheck.  We also discussed ear congestion, itchy eyes, and sore throat. This may be a viral illness and/or season allergies. We discussed supportive care and zyrtec daily. If this fails to improve. Follow up.   Dr. Salvadore Dom

## 2021-02-21 ENCOUNTER — Other Ambulatory Visit: Payer: Self-pay | Admitting: Family Medicine

## 2021-02-21 DIAGNOSIS — Z09 Encounter for follow-up examination after completed treatment for conditions other than malignant neoplasm: Secondary | ICD-10-CM

## 2021-02-21 DIAGNOSIS — N644 Mastodynia: Secondary | ICD-10-CM

## 2021-02-24 ENCOUNTER — Ambulatory Visit: Payer: BLUE CROSS/BLUE SHIELD | Admitting: Family Medicine

## 2021-02-25 ENCOUNTER — Telehealth: Payer: Self-pay

## 2021-02-25 NOTE — Telephone Encounter (Signed)
Patient's daughter calls nurse line regarding side effects to increasing amlodipine dosage. Daughter reports that since increasing, patient has been having swelling in face (around cheeks) and BLE edema. Denies SHOB, chest pain, visual changes, difficulty swallowing or swelling in the throat. States that mother has been complaining of daily headaches since before increasing dosage.   Precepted with Dr. Deirdre Priest, recommended that patient cut back to 5 mg amlodipine and to forward to provider for further recommendations.   Informed patient's daughter of recommendation. Verbalizes understanding. Provided with UC/ED precautions.   Please return call to daughter at [phone].  Veronda Prude, RN

## 2021-03-02 NOTE — Telephone Encounter (Signed)
Talked to daughter and swelling has improved with decreased dose of amlodipine. Continues to have headaches. Denies shortness of breath, chest pain, vision changes, emesis.   Recommended UC/ED if headaches accompanied by these things. Headaches are chronic.   Will follow up at next visit 2/23. Daughter appreciative of call.   Dominico Rod Autry-Lott, DO 03/02/2021, 8:56 AM PGY-3, [facility]

## 2021-03-09 ENCOUNTER — Ambulatory Visit: Payer: BLUE CROSS/BLUE SHIELD

## 2021-03-10 ENCOUNTER — Other Ambulatory Visit: Payer: Self-pay

## 2021-03-10 ENCOUNTER — Ambulatory Visit (INDEPENDENT_AMBULATORY_CARE_PROVIDER_SITE_OTHER): Payer: BLUE CROSS/BLUE SHIELD | Admitting: Family Medicine

## 2021-03-10 ENCOUNTER — Encounter: Payer: Self-pay | Admitting: Family Medicine

## 2021-03-10 VITALS — BP 153/93 | HR 77 | Wt 156.2 lb

## 2021-03-10 DIAGNOSIS — I10 Essential (primary) hypertension: Secondary | ICD-10-CM

## 2021-03-10 DIAGNOSIS — R300 Dysuria: Secondary | ICD-10-CM

## 2021-03-10 DIAGNOSIS — R413 Other amnesia: Secondary | ICD-10-CM | POA: Diagnosis not present

## 2021-03-10 LAB — POCT URINALYSIS DIP (MANUAL ENTRY)
Bilirubin, UA: NEGATIVE
Glucose, UA: NEGATIVE mg/dL
Ketones, POC UA: NEGATIVE mg/dL
Nitrite, UA: NEGATIVE
Protein Ur, POC: NEGATIVE mg/dL
Spec Grav, UA: 1.015 (ref 1.010–1.025)
Urobilinogen, UA: 0.2 E.U./dL
pH, UA: 8.5 — AB (ref 5.0–8.0)

## 2021-03-10 LAB — POCT UA - MICROSCOPIC ONLY

## 2021-03-10 MED ORDER — HYDROCHLOROTHIAZIDE 12.5 MG PO TABS: 6.2500 mg | ORAL_TABLET | Freq: Every day | ORAL | 1 refills | Status: DC

## 2021-03-10 MED ORDER — CEPHALEXIN 500 MG PO CAPS: 500.0000 mg | ORAL_CAPSULE | Freq: Two times a day (BID) | ORAL | 0 refills | Status: AC

## 2021-03-10 NOTE — Patient Instructions (Signed)
It was wonderful to see you today.  Please bring ALL of your medications with you to every visit.   Today we talked about:  Elevated blood pressure-I have started hydrochlorothiazide 2.5 mg we will follow-up in 2 weeks for blood pressure recheck and medication management.  We will likely try to get your physical done at this time as well. He will be scheduled for geriatric clinic please keep in mind appointment may not be until May Your urinalysis is being sent off for culture for now I will treat you for urinary tract infection Please refer to the handout to have your mammogram done.  Please be sure to schedule follow up at the front  desk before you leave today.   If you haven't already, sign up for My Chart to have easy access to your labs results, and communication with your primary care physician.  Please call the clinic at [phone] if your symptoms worsen or you have any concerns. It was our pleasure to serve you.  Dr. Salvadore Dom

## 2021-03-10 NOTE — Progress Notes (Signed)
° ° °  SUBJECTIVE:   CHIEF COMPLAINT / HPI:   Hypertension - Medications: Amlodipine 5 mg daily, attempted 10 mg but has LE edema that is improving - Compliance: Yes - Denies any SOB, CP, vision changes  Dysuria Several days of lower abdominal pain, dysuria, and low back pain. Denies fever, vaginal discharge, nausea and emesis  Follow up, [phone] Daughter states mother has memory impairment but she is not sure to what extent. Desires further evaluation.   PERTINENT  PMH / PSH: HLD, DM  OBJECTIVE:   BP (!) 153/93    Pulse 77    Wt 156 lb 3.2 oz (70.9 kg)    SpO2 100%    BMI 28.57 kg/m   Physical Exam Vitals reviewed.  Constitutional:      General: She is not in acute distress.    Appearance: She is not ill-appearing, toxic-appearing or diaphoretic.  Cardiovascular:     Rate and Rhythm: Normal rate and regular rhythm.     Heart sounds: Normal heart sounds.  Pulmonary:     Effort: Pulmonary effort is normal.     Breath sounds: Normal breath sounds.  Abdominal:     General: Bowel sounds are normal.     Tenderness: There is abdominal tenderness. There is no right CVA tenderness, left CVA tenderness, guarding or rebound.     Comments: Suprapubic tenderness  Musculoskeletal:     Right lower leg: No edema.     Left lower leg: No edema.  Neurological:     Mental Status: She is alert and oriented to person, place, and time.  Psychiatric:        Mood and Affect: Mood normal.        Behavior: Behavior normal.     ASSESSMENT/PLAN:   1. Hypertension, unspecified type Not well controlled. Attempted 10 mg of amlodipine and patient reported LE edema. She appears to be sensitive to minor changes will start small dose of HCTZ with the intention of increasing. For now will do half tab of 12.5 mg with the intention of increasing at follow up to a full tablet.  - hydrochlorothiazide (HYDRODIURIL) 12.5 MG tablet; Take 0.5 tablets (6.25 mg total) by mouth daily.  Dispense: 30 tablet; Refill:  1 - Basic Metabolic Panel  2. Dysuria UA with leuks. Will treat due to symptoms and follow up culture.  - Urine Culture - cephALEXin (KEFLEX) 500 MG capsule; Take 1 capsule (500 mg total) by mouth 2 (two) times daily for 5 days.  Dispense: 10 capsule; Refill: 0 - POCT UA - Microscopic Only  3. Memory changes -Discussed evaluation in geri clinic with patient and daughter. Made Jasmine, [place] aware. Will plan to schedule.    Gerlene Fee, [place]

## 2021-03-11 LAB — BASIC METABOLIC PANEL
BUN/Creatinine Ratio: 11 (ref 9–23)
BUN: 14 mg/dL (ref 6–24)
CO2: 25 mmol/L (ref 20–29)
Calcium: 9.8 mg/dL (ref 8.7–10.2)
Chloride: 101 mmol/L (ref 96–106)
Creatinine, Ser: 1.22 mg/dL — ABNORMAL HIGH (ref 0.57–1.00)
Glucose: 106 mg/dL — ABNORMAL HIGH (ref 70–99)
Potassium: 4.8 mmol/L (ref 3.5–5.2)
Sodium: 141 mmol/L (ref 134–144)
eGFR: 53 mL/min/{1.73_m2} — ABNORMAL LOW (ref >59)

## 2021-03-13 LAB — URINE CULTURE

## 2021-03-17 ENCOUNTER — Encounter: Payer: Self-pay | Admitting: Internal Medicine

## 2021-03-28 ENCOUNTER — Ambulatory Visit: Payer: BLUE CROSS/BLUE SHIELD | Admitting: Family Medicine

## 2021-03-28 NOTE — Patient Instructions (Incomplete)
It was wonderful to see you today. ? ?Please bring ALL of your medications with you to every visit.  ? ?Today we talked about: ? ?** ? ?Please be sure to schedule follow up at the front  desk before you leave today.  ? ?If you haven't already, sign up for My Chart to have easy access to your labs results, and communication with your primary care physician. ? ?Please call the clinic at [phone] if your symptoms worsen or you have any concerns. It was our pleasure to serve you. ? ?Dr. Autry-Lott ? ?

## 2021-03-28 NOTE — Progress Notes (Incomplete)
? ? ?  SUBJECTIVE:  ? ?CHIEF COMPLAINT / HPI:  ? ?Hypertension ?- Medications: Amlodipine 5 mg daily, attempted 10 mg but has LE edema that is improving ?- Compliance: Yes ?- Denies any SOB, CP, vision changes ? ?PERTINENT  PMH / PSH: *** ? ?OBJECTIVE:  ? ?There were no vitals taken for this visit.  ?*** ? ?ASSESSMENT/PLAN:  ? ?No problem-specific Assessment & Plan notes found for this encounter. ?  ? ? ?Pamela Sanguinetti Autry-Lott, DO ?[facility]  ?

## 2021-03-30 ENCOUNTER — Other Ambulatory Visit: Payer: Self-pay | Admitting: Family Medicine

## 2021-03-30 DIAGNOSIS — R300 Dysuria: Secondary | ICD-10-CM

## 2021-04-05 ENCOUNTER — Ambulatory Visit (INDEPENDENT_AMBULATORY_CARE_PROVIDER_SITE_OTHER): Payer: BLUE CROSS/BLUE SHIELD | Admitting: Family Medicine

## 2021-04-05 ENCOUNTER — Encounter: Payer: Self-pay | Admitting: Family Medicine

## 2021-04-05 ENCOUNTER — Other Ambulatory Visit: Payer: Self-pay

## 2021-04-05 VITALS — BP 142/80 | HR 79 | Ht 62.0 in | Wt 158.8 lb

## 2021-04-05 DIAGNOSIS — I10 Essential (primary) hypertension: Secondary | ICD-10-CM

## 2021-04-05 MED ORDER — HYDROCHLOROTHIAZIDE 12.5 MG PO TABS: 12.5000 mg | ORAL_TABLET | Freq: Every day | ORAL | 1 refills | Status: DC

## 2021-04-05 MED ORDER — AMLODIPINE BESYLATE 5 MG PO TABS: 10.0000 mg | ORAL_TABLET | Freq: Every day | ORAL | 3 refills | Status: AC

## 2021-04-05 NOTE — Progress Notes (Signed)
? ? ?  SUBJECTIVE:  ? ?CHIEF COMPLAINT / HPI:  ? ?Hypertension: ?- Medications: Amlodipine 5 mg daily (out for a week), HCTZ 12.5 mg daily  ?- Compliance: Yes ?- Checking BP at home: Yes SBP 138-145 ?- Denies any SOB, CP, vision changes, LE edema, medication SEs, or symptoms of hypotension ? ?PERTINENT  PMH / PSH: As above.  ? ?OBJECTIVE:  ? ?BP (!) 142/80   Pulse 79   Ht 5\' 2"  (1.575 m)   Wt 158 lb 12.8 oz (72 kg)   SpO2 99%   BMI 29.04 kg/m?   ?Physical Exam ?Vitals reviewed.  ?Constitutional:   ?   General: She is not in acute distress. ?   Appearance: She is not ill-appearing or diaphoretic.  ?Cardiovascular:  ?   Rate and Rhythm: Normal rate and regular rhythm.  ?   Heart sounds: Normal heart sounds.  ?Pulmonary:  ?   Effort: Pulmonary effort is normal.  ?   Breath sounds: Normal breath sounds.  ?Neurological:  ?   Mental Status: She is alert and oriented to person, place, and time.  ?Psychiatric:     ?   Mood and Affect: Mood normal.     ?   Behavior: Behavior normal.  ? ?ASSESSMENT/PLAN:  ? ?Primary hypertension ?Improved from last few visits. Off amlodipine for 1 week. Will restart and check BPs at home. Parameters given if too low otherwise will follow up in 1 month for nurse visit.  ?- Basic Metabolic Panel ?- hydrochlorothiazide (HYDRODIURIL) 12.5 MG tablet; Take 1 tablet (12.5 mg total) by mouth daily.  Dispense: 30 tablet; Refill: 1 ?- amLODipine (NORVASC) 5 MG tablet; Take 2 tablets (10 mg total) by mouth at bedtime.  Dispense: 90 tablet; Refill: 3 ? ?Pamela Leblond Autry-Lott, DO ?[facility]  ?

## 2021-04-05 NOTE — Patient Instructions (Signed)
It was wonderful to see you today. ? ?Please bring ALL of your medications with you to every visit.  ? ?Today we talked about: ? ?Continuing amlodipine 5 mg and hydrochlorothiazide 12.5 mg daily.  We will follow-up in 1 month to recheck blood pressure unless you need to be seen sooner.  This can be a nurse visit. ? ?Please be sure to schedule follow up at the front  desk before you leave today.  ? ?If you haven't already, sign up for My Chart to have easy access to your labs results, and communication with your primary care physician. ? ?Please call the clinic at [phone] if your symptoms worsen or you have any concerns. It was our pleasure to serve you. ? ?Dr. Salvadore Dom ? ?

## 2021-04-06 LAB — BASIC METABOLIC PANEL
BUN/Creatinine Ratio: 14 (ref 9–23)
BUN: 14 mg/dL (ref 6–24)
CO2: 23 mmol/L (ref 20–29)
Calcium: 9.1 mg/dL (ref 8.7–10.2)
Chloride: 101 mmol/L (ref 96–106)
Creatinine, Ser: 0.99 mg/dL (ref 0.57–1.00)
Glucose: 128 mg/dL — ABNORMAL HIGH (ref 70–99)
Potassium: 4.5 mmol/L (ref 3.5–5.2)
Sodium: 140 mmol/L (ref 134–144)
eGFR: 69 mL/min/{1.73_m2} (ref >59)

## 2021-04-28 ENCOUNTER — Encounter: Payer: BLUE CROSS/BLUE SHIELD | Admitting: Internal Medicine

## 2021-05-07 DIAGNOSIS — L03213 Periorbital cellulitis: Secondary | ICD-10-CM

## 2021-05-07 HISTORY — DX: Periorbital cellulitis: L03.213

## 2021-05-10 ENCOUNTER — Ambulatory Visit (INDEPENDENT_AMBULATORY_CARE_PROVIDER_SITE_OTHER): Payer: BLUE CROSS/BLUE SHIELD | Admitting: Family Medicine

## 2021-05-10 ENCOUNTER — Encounter: Payer: Self-pay | Admitting: Family Medicine

## 2021-05-10 VITALS — BP 120/93 | HR 84 | Ht 62.0 in | Wt 155.2 lb

## 2021-05-10 DIAGNOSIS — I10 Essential (primary) hypertension: Secondary | ICD-10-CM | POA: Diagnosis not present

## 2021-05-10 DIAGNOSIS — H10503 Unspecified blepharoconjunctivitis, bilateral: Secondary | ICD-10-CM | POA: Diagnosis not present

## 2021-05-10 NOTE — Patient Instructions (Addendum)
It was wonderful to see you today. ? ?Please bring ALL of your medications with you to every visit.  ? ?Today we talked about: ? ?Following up with the eye doctor.  You can use lubricating eyedrops that are over-the-counter.  Stop atropine eyedrops. ?Continue current medications for blood pressure.  Follow-up in 1 week for nurse visit. ? ?Please be sure to schedule follow up at the front  desk before you leave today.  ? ?Please call the clinic at [phone] if your symptoms worsen or you have any concerns. It was our pleasure to serve you. ? ?Dr. Salvadore Dom ? ?

## 2021-05-10 NOTE — Progress Notes (Signed)
? ? ?  SUBJECTIVE:  ? ?CHIEF COMPLAINT / HPI:  ? ?Hypertension: ?- Medications: Amlodipine 10 mg daily, HCTZ 12.5 mg daily  ?- Compliance: Yes ?- Checking BP at home: Yes SBP 140s ?- Denies any SOB, CP, vision changes, LE edema, medication SEs, or symptoms of hypotension ? ?Follow up, conjunctivitis ?Sought care in ED 4/22 for pink eye- given antibiotics and atropine eye drops and referred to ophthalmology. Right eye redness began 1 week ago and is worse than left eye which began 4 days ago. They ache, are itchy, and sensitive to light. She endorses her daughter, son-in-law, and grandchild has the same symptoms. Denies fever, sore throat, rhinorrhea.  ? ?PERTINENT  PMH / PSH: Concern for memory issues (Geriatrics eval 06/02/21) ? ?OBJECTIVE:  ? ?BP (!) 120/93   Pulse 84   Ht 5\' 2"  (1.575 m)   Wt 155 lb 4 oz (70.4 kg)   SpO2 99%   BMI 28.40 kg/m?   ?Vision Screening  ? Right eye Left eye Both eyes  ?Without correction 20/70 20/50 20/50   ?With correction     ?Physical Exam ?Vitals reviewed.  ?Constitutional:   ?   General: She is not in acute distress. ?   Appearance: She is not ill-appearing, toxic-appearing or diaphoretic.  ?HENT:  ?   Head: Normocephalic.  ?Eyes:  ?   General:     ?   Right eye: No discharge.     ?   Left eye: No discharge.  ?   Extraocular Movements: Extraocular movements intact.  ?   Pupils: Pupils are equal, round, and reactive to light.  ?   Comments: Bilaterally edematous and erythematous conjunctivae R>L.  ?Cardiovascular:  ?   Rate and Rhythm: Normal rate and regular rhythm.  ?Pulmonary:  ?   Effort: Pulmonary effort is normal.  ?   Breath sounds: Normal breath sounds.  ?Neurological:  ?   Mental Status: She is alert and oriented to person, place, and time.  ?Psychiatric:     ?   Mood and Affect: Mood normal.     ?   Behavior: Behavior normal.  ? ?ASSESSMENT/PLAN:  ? ?1. Primary hypertension ?Diastolic slightly above goal on repeat BP today. 1 week nurse visit for BP check. Can consider  increased HCTZ to 25 mg if needed.  ? ?2. Blepharoconjunctivitis of both eyes, unspecified blepharoconjunctivitis type ?Follow up, ongoing for 4 days-1 week. Burning sensation with eye drops. Given Augmentin, erythromycin eye drops, atropine eye drops. Advised to discontinue atropine eye drops. Can do lubricating eye drops as needed. No discharge visualized on exam possibly viral component. Has other known household contacts with similar symptoms. Advised to finish course of abx and follow up with ophtho as previously instructed.  ? ? , DO ?[facility]  ?

## 2021-05-17 ENCOUNTER — Ambulatory Visit: Payer: BLUE CROSS/BLUE SHIELD

## 2021-05-17 VITALS — BP 132/78 | HR 90

## 2021-05-17 DIAGNOSIS — I10 Essential (primary) hypertension: Secondary | ICD-10-CM

## 2021-05-17 NOTE — Progress Notes (Signed)
Patient here today for BP check.     ? ?Last BP was on 05/10/2021 and was 120/93. ? ?BP today is 132/78 with a pulse of 90.   ? ?Checked BP in left arm with large cuff.   ? ?Symptoms present: None.  ? ?Patient last took BP med Amlodipine (bedtime) and HCTZ today.   ? ?Patient was seen in the ED for conjunctivitis on 4/22. Patient requesting a FU apt. Patient scheduled for tomorrow for FU.  ? ?

## 2021-05-18 ENCOUNTER — Ambulatory Visit (INDEPENDENT_AMBULATORY_CARE_PROVIDER_SITE_OTHER): Payer: BLUE CROSS/BLUE SHIELD | Admitting: Family Medicine

## 2021-05-18 ENCOUNTER — Encounter: Payer: Self-pay | Admitting: Family Medicine

## 2021-05-18 VITALS — BP 112/80 | HR 84 | Ht 62.0 in | Wt 156.0 lb

## 2021-05-18 DIAGNOSIS — I10 Essential (primary) hypertension: Secondary | ICD-10-CM | POA: Diagnosis not present

## 2021-05-18 DIAGNOSIS — H1013 Acute atopic conjunctivitis, bilateral: Secondary | ICD-10-CM | POA: Diagnosis not present

## 2021-05-18 MED ORDER — OLOPATADINE HCL 0.2 % OP SOLN: OPHTHALMIC | 1 refills | Status: AC

## 2021-05-18 NOTE — Progress Notes (Signed)
? ? ?  SUBJECTIVE:  ? ?CHIEF COMPLAINT / HPI:  ? ?Hypertension: ?- Medications: Amlodipine 10 mg daily, HCTZ 12.5 mg daily  ?- Compliance: Yes ?- Checking BP at home: Yes SBP 140s ?- Denies any SOB, CP, vision changes, LE edema, medication SEs, or symptoms of hypotension ?  ?Follow up, conjunctivitis ?Sought care in ED 4/22 for pink eye- given antibiotics and atropine eye drops and referred to ophthalmology. Right eye redness began 1 week ago and is worse than left eye which began 4 days ago. They ache, are itchy, and sensitive to light. She endorses her daughter, son-in-law, and grandchild has the same symptoms. Denies fever, sore throat, rhinorrhea. ?-The above symptoms (swelling and redness) are improving since stopping the atropine eye drops but she continues to have itchy dry eyes. She is not using any eye drops at this time.  ? ?PERTINENT  PMH / PSH: As above.  ? ?OBJECTIVE:  ? ?BP 112/80   Pulse 84   Wt 156 lb (70.8 kg)   SpO2 98%   BMI 28.53 kg/m?   ?Physical Exam ?Vitals reviewed.  ?Constitutional:   ?   General: She is not in acute distress. ?   Appearance: She is not ill-appearing, toxic-appearing or diaphoretic.  ?Eyes:  ?   Pupils: Pupils are equal, round, and reactive to light.  ?   Comments: Mildly erythematous conjunctivae bilaterally with improved right sided periocular swelling  ?Cardiovascular:  ?   Rate and Rhythm: Normal rate and regular rhythm.  ?   Heart sounds: Normal heart sounds.  ?Pulmonary:  ?   Effort: Pulmonary effort is normal.  ?   Breath sounds: Normal breath sounds.  ?Neurological:  ?   Mental Status: She is alert and oriented to person, place, and time.  ?Psychiatric:     ?   Mood and Affect: Mood normal.     ?   Behavior: Behavior normal.  ? ?ASSESSMENT/PLAN:  ? ?1. Primary hypertension ?At goal. Continue medications.  ? ?2. Allergic conjunctivitis of both eyes ?Improving. Continues to have irritation consistent with allergies. Will prescribe antihistamine eye drops for relief.   ?- Olopatadine HCl (PATADAY) 0.2 % SOLN; 1 drop each eye daily  Dispense: 2.5 mL; Refill: 1 ? ?Moua Rasmusson Autry-Lott, DO ?[place]  ?

## 2021-05-18 NOTE — Patient Instructions (Signed)
It was wonderful to see you today. ? ?Please bring ALL of your medications with you to every visit.  ? ?Today we talked about: ? ?Eye irritation which is likely allergic conjunctivitis.  I have prescribed eyedrops to be used daily. ? ?Continue current medications for your blood pressure your blood pressure was within goal today. ? ?Please call the clinic at [phone] if your symptoms worsen or you have any concerns. It was our pleasure to serve you. ? ?Dr. Janus Molder ? ?

## 2021-06-01 ENCOUNTER — Encounter: Payer: Self-pay | Admitting: Family Medicine

## 2021-06-01 ENCOUNTER — Telehealth: Payer: Self-pay | Admitting: *Deleted

## 2021-06-01 NOTE — Telephone Encounter (Signed)
Dr. McDiarmid reviewed patient's chart and her reason for geri assessment.  She will need to be evaluated by Dr. Manson Passey or Dr. Miquel Dunn for the [phone] form.  I tried to call her and let her know but there was no answer or voicemail set up. I have cancelled her appt for tomorrow 06-02-21.  Please review Teams on how to start the process for patient and begin form completion.  Teams will also explain next steps and how to connect patient with [facility]. ? ? ?Thanks Parys Elenbaas,CMA ? ?

## 2021-06-02 ENCOUNTER — Ambulatory Visit: Payer: BLUE CROSS/BLUE SHIELD

## 2021-06-02 NOTE — Telephone Encounter (Signed)
For correct completion of [phone] form, consultation in [facility] is appropriate.

## 2021-06-28 ENCOUNTER — Other Ambulatory Visit: Payer: Self-pay | Admitting: Family Medicine

## 2021-06-28 DIAGNOSIS — I10 Essential (primary) hypertension: Secondary | ICD-10-CM

## 2021-10-08 ENCOUNTER — Other Ambulatory Visit: Payer: Self-pay | Admitting: Family Medicine

## 2021-10-08 DIAGNOSIS — G43819 Other migraine, intractable, without status migrainosus: Secondary | ICD-10-CM
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
## 2023-06-26 ENCOUNTER — Encounter: Payer: Self-pay | Admitting: *Deleted
